# Patient Record
Sex: Female | Born: 1975 | Hispanic: Yes | Marital: Single | Smoking: Never smoker
Health system: Southern US, Community
[De-identification: ages and names within clinical notes are randomized; demographics above are authoritative.]

## PROBLEM LIST (undated history)

## (undated) DIAGNOSIS — T148XXA Other injury of unspecified body region, initial encounter: Secondary | ICD-10-CM

## (undated) DIAGNOSIS — IMO0002 Reserved for concepts with insufficient information to code with codable children: Secondary | ICD-10-CM

## (undated) DIAGNOSIS — R569 Unspecified convulsions: Secondary | ICD-10-CM

## (undated) DIAGNOSIS — F419 Anxiety disorder, unspecified: Secondary | ICD-10-CM

## (undated) DIAGNOSIS — T4145XA Adverse effect of unspecified anesthetic, initial encounter: Secondary | ICD-10-CM

## (undated) DIAGNOSIS — J45909 Unspecified asthma, uncomplicated: Secondary | ICD-10-CM

## (undated) DIAGNOSIS — F329 Major depressive disorder, single episode, unspecified: Secondary | ICD-10-CM

## (undated) DIAGNOSIS — K219 Gastro-esophageal reflux disease without esophagitis: Secondary | ICD-10-CM

## (undated) DIAGNOSIS — G8929 Other chronic pain: Principal | ICD-10-CM

## (undated) DIAGNOSIS — F32A Depression, unspecified: Secondary | ICD-10-CM

## (undated) DIAGNOSIS — T8859XA Other complications of anesthesia, initial encounter: Secondary | ICD-10-CM

## (undated) DIAGNOSIS — K5900 Constipation, unspecified: Secondary | ICD-10-CM

## (undated) DIAGNOSIS — M21379 Foot drop, unspecified foot: Secondary | ICD-10-CM

## (undated) DIAGNOSIS — M549 Dorsalgia, unspecified: Principal | ICD-10-CM

## (undated) DIAGNOSIS — R87619 Unspecified abnormal cytological findings in specimens from cervix uteri: Secondary | ICD-10-CM

## (undated) HISTORY — DX: Other injury of unspecified body region, initial encounter: T14.8XXA

## (undated) HISTORY — DX: Dorsalgia, unspecified: M54.9

## (undated) HISTORY — DX: Reserved for concepts with insufficient information to code with codable children: IMO0002

## (undated) HISTORY — DX: Other chronic pain: G89.29

## (undated) HISTORY — DX: Unspecified abnormal cytological findings in specimens from cervix uteri: R87.619

## (undated) HISTORY — DX: Foot drop, unspecified foot: M21.379

---

## 1998-10-23 DIAGNOSIS — G8929 Other chronic pain: Secondary | ICD-10-CM

## 1998-10-23 HISTORY — DX: Other chronic pain: G89.29

## 1998-10-23 HISTORY — PX: HEMILAMINOTOMY LUMBAR SPINE: SUR654

## 2000-05-29 ENCOUNTER — Inpatient Hospital Stay (HOSPITAL_COMMUNITY): Admission: EM | Admit: 2000-05-29 | Discharge: 2000-06-01 | Payer: Self-pay | Admitting: Emergency Medicine

## 2000-05-29 ENCOUNTER — Encounter: Payer: Self-pay | Admitting: Emergency Medicine

## 2000-05-31 ENCOUNTER — Encounter: Payer: Self-pay | Admitting: Orthopedic Surgery

## 2000-06-13 ENCOUNTER — Encounter: Admission: RE | Admit: 2000-06-13 | Discharge: 2000-07-17 | Payer: Self-pay | Admitting: Orthopedic Surgery

## 2000-06-19 ENCOUNTER — Ambulatory Visit (HOSPITAL_COMMUNITY): Admission: RE | Admit: 2000-06-19 | Discharge: 2000-06-19 | Payer: Self-pay | Admitting: Orthopedic Surgery

## 2000-06-19 ENCOUNTER — Encounter: Payer: Self-pay | Admitting: Orthopedic Surgery

## 2000-07-23 ENCOUNTER — Encounter (INDEPENDENT_AMBULATORY_CARE_PROVIDER_SITE_OTHER): Payer: Self-pay | Admitting: *Deleted

## 2000-07-23 LAB — CONVERTED CEMR LAB

## 2000-08-02 ENCOUNTER — Encounter: Admission: RE | Admit: 2000-08-02 | Discharge: 2000-08-02 | Payer: Self-pay | Admitting: Family Medicine

## 2000-08-02 ENCOUNTER — Other Ambulatory Visit: Admission: RE | Admit: 2000-08-02 | Discharge: 2000-08-02 | Payer: Self-pay | Admitting: Family Medicine

## 2000-11-01 ENCOUNTER — Emergency Department (HOSPITAL_COMMUNITY): Admission: EM | Admit: 2000-11-01 | Discharge: 2000-11-01 | Payer: Self-pay | Admitting: Emergency Medicine

## 2000-11-16 ENCOUNTER — Emergency Department (HOSPITAL_COMMUNITY): Admission: EM | Admit: 2000-11-16 | Discharge: 2000-11-16 | Payer: Self-pay | Admitting: Emergency Medicine

## 2000-11-19 ENCOUNTER — Emergency Department (HOSPITAL_COMMUNITY): Admission: EM | Admit: 2000-11-19 | Discharge: 2000-11-19 | Payer: Self-pay | Admitting: Emergency Medicine

## 2000-12-04 ENCOUNTER — Encounter: Admission: RE | Admit: 2000-12-04 | Discharge: 2000-12-04 | Payer: Self-pay | Admitting: Family Medicine

## 2000-12-05 ENCOUNTER — Ambulatory Visit (HOSPITAL_COMMUNITY): Admission: RE | Admit: 2000-12-05 | Discharge: 2000-12-05 | Payer: Self-pay | Admitting: Orthopedic Surgery

## 2000-12-05 ENCOUNTER — Encounter: Payer: Self-pay | Admitting: Orthopedic Surgery

## 2000-12-11 ENCOUNTER — Encounter: Admission: RE | Admit: 2000-12-11 | Discharge: 2000-12-11 | Payer: Self-pay | Admitting: Sports Medicine

## 2000-12-11 ENCOUNTER — Other Ambulatory Visit: Admission: RE | Admit: 2000-12-11 | Discharge: 2000-12-11 | Payer: Self-pay | Admitting: Family Medicine

## 2000-12-19 ENCOUNTER — Ambulatory Visit (HOSPITAL_COMMUNITY): Admission: RE | Admit: 2000-12-19 | Discharge: 2000-12-19 | Payer: Self-pay | Admitting: Orthopedic Surgery

## 2000-12-19 ENCOUNTER — Encounter: Payer: Self-pay | Admitting: Orthopedic Surgery

## 2001-01-28 ENCOUNTER — Inpatient Hospital Stay (HOSPITAL_COMMUNITY): Admission: RE | Admit: 2001-01-28 | Discharge: 2001-01-29 | Payer: Self-pay | Admitting: Orthopaedic Surgery

## 2001-04-19 ENCOUNTER — Encounter: Admission: RE | Admit: 2001-04-19 | Discharge: 2001-04-19 | Payer: Self-pay | Admitting: Obstetrics & Gynecology

## 2001-05-10 ENCOUNTER — Encounter: Payer: Self-pay | Admitting: Emergency Medicine

## 2001-05-10 ENCOUNTER — Emergency Department (HOSPITAL_COMMUNITY): Admission: EM | Admit: 2001-05-10 | Discharge: 2001-05-11 | Payer: Self-pay | Admitting: Emergency Medicine

## 2001-08-16 ENCOUNTER — Encounter: Admission: RE | Admit: 2001-08-16 | Discharge: 2001-08-16 | Payer: Self-pay | Admitting: Family Medicine

## 2001-08-16 ENCOUNTER — Encounter (INDEPENDENT_AMBULATORY_CARE_PROVIDER_SITE_OTHER): Payer: Self-pay | Admitting: *Deleted

## 2001-08-16 ENCOUNTER — Other Ambulatory Visit: Admission: RE | Admit: 2001-08-16 | Discharge: 2001-08-16 | Payer: Self-pay | Admitting: Family Medicine

## 2001-09-18 ENCOUNTER — Encounter: Admission: RE | Admit: 2001-09-18 | Discharge: 2001-09-18 | Payer: Self-pay | Admitting: Family Medicine

## 2001-11-06 ENCOUNTER — Inpatient Hospital Stay (HOSPITAL_COMMUNITY): Admission: AD | Admit: 2001-11-06 | Discharge: 2001-11-06 | Payer: Self-pay | Admitting: Obstetrics

## 2001-11-19 ENCOUNTER — Encounter: Admission: RE | Admit: 2001-11-19 | Discharge: 2001-11-19 | Payer: Self-pay | Admitting: Family Medicine

## 2001-11-27 ENCOUNTER — Ambulatory Visit (HOSPITAL_COMMUNITY): Admission: RE | Admit: 2001-11-27 | Discharge: 2001-11-27 | Payer: Self-pay | Admitting: Family Medicine

## 2001-12-05 ENCOUNTER — Encounter: Admission: RE | Admit: 2001-12-05 | Discharge: 2001-12-05 | Payer: Self-pay | Admitting: *Deleted

## 2001-12-06 ENCOUNTER — Encounter: Admission: RE | Admit: 2001-12-06 | Discharge: 2001-12-06 | Payer: Self-pay | Admitting: Family Medicine

## 2001-12-18 ENCOUNTER — Inpatient Hospital Stay (HOSPITAL_COMMUNITY): Admission: AD | Admit: 2001-12-18 | Discharge: 2001-12-18 | Payer: Self-pay | Admitting: *Deleted

## 2001-12-23 ENCOUNTER — Encounter (INDEPENDENT_AMBULATORY_CARE_PROVIDER_SITE_OTHER): Payer: Self-pay | Admitting: Specialist

## 2001-12-23 ENCOUNTER — Inpatient Hospital Stay (HOSPITAL_COMMUNITY): Admission: AD | Admit: 2001-12-23 | Discharge: 2001-12-25 | Payer: Self-pay | Admitting: *Deleted

## 2004-07-17 ENCOUNTER — Emergency Department (HOSPITAL_COMMUNITY): Admission: EM | Admit: 2004-07-17 | Discharge: 2004-07-18 | Payer: Self-pay | Admitting: Emergency Medicine

## 2004-08-01 ENCOUNTER — Emergency Department (HOSPITAL_COMMUNITY): Admission: EM | Admit: 2004-08-01 | Discharge: 2004-08-01 | Payer: Self-pay | Admitting: Family Medicine

## 2004-11-18 ENCOUNTER — Emergency Department (HOSPITAL_COMMUNITY): Admission: EM | Admit: 2004-11-18 | Discharge: 2004-11-18 | Payer: Self-pay | Admitting: Emergency Medicine

## 2005-01-30 ENCOUNTER — Emergency Department (HOSPITAL_COMMUNITY): Admission: EM | Admit: 2005-01-30 | Discharge: 2005-01-31 | Payer: Self-pay | Admitting: Emergency Medicine

## 2005-03-30 ENCOUNTER — Emergency Department (HOSPITAL_COMMUNITY): Admission: EM | Admit: 2005-03-30 | Discharge: 2005-03-30 | Payer: Self-pay | Admitting: Emergency Medicine

## 2005-04-06 ENCOUNTER — Emergency Department (HOSPITAL_COMMUNITY): Admission: EM | Admit: 2005-04-06 | Discharge: 2005-04-06 | Payer: Self-pay | Admitting: Family Medicine

## 2005-08-22 ENCOUNTER — Emergency Department (HOSPITAL_COMMUNITY): Admission: EM | Admit: 2005-08-22 | Discharge: 2005-08-22 | Payer: Self-pay | Admitting: Emergency Medicine

## 2005-09-12 ENCOUNTER — Ambulatory Visit: Payer: Self-pay | Admitting: Internal Medicine

## 2005-09-15 ENCOUNTER — Ambulatory Visit (HOSPITAL_COMMUNITY): Admission: RE | Admit: 2005-09-15 | Discharge: 2005-09-15 | Payer: Self-pay | Admitting: Internal Medicine

## 2005-09-20 ENCOUNTER — Ambulatory Visit: Payer: Self-pay | Admitting: Internal Medicine

## 2005-10-09 ENCOUNTER — Emergency Department (HOSPITAL_COMMUNITY): Admission: EM | Admit: 2005-10-09 | Discharge: 2005-10-09 | Payer: Self-pay | Admitting: Emergency Medicine

## 2005-10-10 ENCOUNTER — Emergency Department (HOSPITAL_COMMUNITY): Admission: EM | Admit: 2005-10-10 | Discharge: 2005-10-10 | Payer: Self-pay | Admitting: *Deleted

## 2005-11-01 ENCOUNTER — Ambulatory Visit: Payer: Self-pay | Admitting: Internal Medicine

## 2005-12-11 ENCOUNTER — Emergency Department (HOSPITAL_COMMUNITY): Admission: EM | Admit: 2005-12-11 | Discharge: 2005-12-11 | Payer: Self-pay | Admitting: Emergency Medicine

## 2005-12-20 ENCOUNTER — Ambulatory Visit: Payer: Self-pay | Admitting: Internal Medicine

## 2006-01-15 ENCOUNTER — Ambulatory Visit: Payer: Self-pay | Admitting: Internal Medicine

## 2006-01-30 ENCOUNTER — Emergency Department (HOSPITAL_COMMUNITY): Admission: EM | Admit: 2006-01-30 | Discharge: 2006-01-30 | Payer: Self-pay | Admitting: Family Medicine

## 2006-05-15 ENCOUNTER — Ambulatory Visit: Payer: Self-pay | Admitting: Internal Medicine

## 2006-05-16 ENCOUNTER — Ambulatory Visit: Payer: Self-pay | Admitting: Hospitalist

## 2006-06-29 ENCOUNTER — Emergency Department: Payer: Self-pay | Admitting: Emergency Medicine

## 2006-07-30 ENCOUNTER — Ambulatory Visit: Payer: Self-pay | Admitting: Internal Medicine

## 2006-11-01 ENCOUNTER — Emergency Department (HOSPITAL_COMMUNITY): Admission: EM | Admit: 2006-11-01 | Discharge: 2006-11-01 | Payer: Self-pay | Admitting: Emergency Medicine

## 2006-11-05 DIAGNOSIS — M5410 Radiculopathy, site unspecified: Secondary | ICD-10-CM

## 2006-11-13 ENCOUNTER — Emergency Department (HOSPITAL_COMMUNITY): Admission: EM | Admit: 2006-11-13 | Discharge: 2006-11-13 | Payer: Self-pay | Admitting: Emergency Medicine

## 2006-11-26 ENCOUNTER — Telehealth (INDEPENDENT_AMBULATORY_CARE_PROVIDER_SITE_OTHER): Payer: Self-pay | Admitting: *Deleted

## 2006-12-21 ENCOUNTER — Encounter (INDEPENDENT_AMBULATORY_CARE_PROVIDER_SITE_OTHER): Payer: Self-pay | Admitting: *Deleted

## 2006-12-26 ENCOUNTER — Telehealth: Payer: Self-pay | Admitting: *Deleted

## 2007-01-21 ENCOUNTER — Telehealth: Payer: Self-pay | Admitting: *Deleted

## 2007-01-23 ENCOUNTER — Encounter (INDEPENDENT_AMBULATORY_CARE_PROVIDER_SITE_OTHER): Payer: Self-pay | Admitting: Internal Medicine

## 2007-02-04 ENCOUNTER — Telehealth (INDEPENDENT_AMBULATORY_CARE_PROVIDER_SITE_OTHER): Payer: Self-pay | Admitting: Internal Medicine

## 2007-03-06 ENCOUNTER — Ambulatory Visit: Payer: Self-pay | Admitting: Physical Medicine & Rehabilitation

## 2007-03-06 ENCOUNTER — Encounter
Admission: RE | Admit: 2007-03-06 | Discharge: 2007-06-04 | Payer: Self-pay | Admitting: Physical Medicine & Rehabilitation

## 2007-03-19 ENCOUNTER — Emergency Department (HOSPITAL_COMMUNITY): Admission: EM | Admit: 2007-03-19 | Discharge: 2007-03-19 | Payer: Self-pay | Admitting: Emergency Medicine

## 2007-03-31 ENCOUNTER — Emergency Department (HOSPITAL_COMMUNITY): Admission: EM | Admit: 2007-03-31 | Discharge: 2007-03-31 | Payer: Self-pay | Admitting: Emergency Medicine

## 2007-04-09 ENCOUNTER — Ambulatory Visit: Payer: Self-pay | Admitting: Physical Medicine & Rehabilitation

## 2007-04-18 ENCOUNTER — Encounter
Admission: RE | Admit: 2007-04-18 | Discharge: 2007-07-17 | Payer: Self-pay | Admitting: Physical Medicine & Rehabilitation

## 2007-04-23 ENCOUNTER — Ambulatory Visit: Payer: Self-pay | Admitting: Physical Medicine & Rehabilitation

## 2007-05-21 ENCOUNTER — Ambulatory Visit: Payer: Self-pay | Admitting: Physical Medicine & Rehabilitation

## 2007-06-20 ENCOUNTER — Encounter
Admission: RE | Admit: 2007-06-20 | Discharge: 2007-09-18 | Payer: Self-pay | Admitting: Physical Medicine & Rehabilitation

## 2007-06-22 ENCOUNTER — Ambulatory Visit (HOSPITAL_COMMUNITY)
Admission: RE | Admit: 2007-06-22 | Discharge: 2007-06-22 | Payer: Self-pay | Admitting: Physical Medicine & Rehabilitation

## 2007-07-12 ENCOUNTER — Encounter
Admission: RE | Admit: 2007-07-12 | Discharge: 2007-07-15 | Payer: Self-pay | Admitting: Physical Medicine & Rehabilitation

## 2007-07-15 ENCOUNTER — Ambulatory Visit: Payer: Self-pay | Admitting: Physical Medicine & Rehabilitation

## 2007-07-23 ENCOUNTER — Emergency Department (HOSPITAL_COMMUNITY): Admission: EM | Admit: 2007-07-23 | Discharge: 2007-07-23 | Payer: Self-pay | Admitting: Emergency Medicine

## 2007-08-19 ENCOUNTER — Ambulatory Visit: Payer: Self-pay | Admitting: Physical Medicine & Rehabilitation

## 2007-09-27 ENCOUNTER — Encounter
Admission: RE | Admit: 2007-09-27 | Discharge: 2007-10-01 | Payer: Self-pay | Admitting: Physical Medicine & Rehabilitation

## 2007-10-01 ENCOUNTER — Ambulatory Visit: Payer: Self-pay | Admitting: Physical Medicine & Rehabilitation

## 2007-10-20 ENCOUNTER — Emergency Department (HOSPITAL_COMMUNITY): Admission: EM | Admit: 2007-10-20 | Discharge: 2007-10-20 | Payer: Self-pay | Admitting: Emergency Medicine

## 2007-11-06 ENCOUNTER — Ambulatory Visit: Payer: Self-pay | Admitting: Internal Medicine

## 2007-11-07 ENCOUNTER — Telehealth: Payer: Self-pay | Admitting: Infectious Disease

## 2007-11-07 ENCOUNTER — Encounter: Payer: Self-pay | Admitting: Infectious Disease

## 2007-11-13 ENCOUNTER — Emergency Department (HOSPITAL_COMMUNITY): Admission: EM | Admit: 2007-11-13 | Discharge: 2007-11-14 | Payer: Self-pay | Admitting: Emergency Medicine

## 2007-11-14 ENCOUNTER — Telehealth (INDEPENDENT_AMBULATORY_CARE_PROVIDER_SITE_OTHER): Payer: Self-pay | Admitting: Internal Medicine

## 2007-11-14 ENCOUNTER — Encounter (INDEPENDENT_AMBULATORY_CARE_PROVIDER_SITE_OTHER): Payer: Self-pay | Admitting: Internal Medicine

## 2007-12-03 ENCOUNTER — Telehealth: Payer: Self-pay | Admitting: Licensed Clinical Social Worker

## 2007-12-16 ENCOUNTER — Encounter (INDEPENDENT_AMBULATORY_CARE_PROVIDER_SITE_OTHER): Payer: Self-pay | Admitting: Internal Medicine

## 2007-12-16 ENCOUNTER — Ambulatory Visit: Payer: Self-pay | Admitting: Internal Medicine

## 2007-12-16 LAB — CONVERTED CEMR LAB
Barbiturate Quant, Ur: NEGATIVE
HCV Ab: NEGATIVE
Hep B Core Total Ab: NEGATIVE
Hepatitis B Surface Ag: NEGATIVE
Phencyclidine (PCP): NEGATIVE
Propoxyphene: NEGATIVE

## 2007-12-17 ENCOUNTER — Encounter (INDEPENDENT_AMBULATORY_CARE_PROVIDER_SITE_OTHER): Payer: Self-pay | Admitting: Internal Medicine

## 2007-12-17 ENCOUNTER — Telehealth: Payer: Self-pay | Admitting: *Deleted

## 2007-12-17 LAB — CONVERTED CEMR LAB
Candida species: NEGATIVE
Gardnerella vaginalis: POSITIVE — AB
Trichomonal Vaginitis: NEGATIVE

## 2007-12-25 ENCOUNTER — Encounter (INDEPENDENT_AMBULATORY_CARE_PROVIDER_SITE_OTHER): Payer: Self-pay | Admitting: Internal Medicine

## 2008-01-03 ENCOUNTER — Telehealth (INDEPENDENT_AMBULATORY_CARE_PROVIDER_SITE_OTHER): Payer: Self-pay | Admitting: Internal Medicine

## 2008-01-06 ENCOUNTER — Encounter (INDEPENDENT_AMBULATORY_CARE_PROVIDER_SITE_OTHER): Payer: Self-pay | Admitting: Internal Medicine

## 2008-01-29 ENCOUNTER — Encounter (INDEPENDENT_AMBULATORY_CARE_PROVIDER_SITE_OTHER): Payer: Self-pay | Admitting: Internal Medicine

## 2008-02-03 ENCOUNTER — Telehealth: Payer: Self-pay | Admitting: *Deleted

## 2008-03-03 ENCOUNTER — Ambulatory Visit: Payer: Self-pay | Admitting: Infectious Disease

## 2008-05-05 ENCOUNTER — Telehealth: Payer: Self-pay | Admitting: *Deleted

## 2008-05-16 ENCOUNTER — Emergency Department (HOSPITAL_COMMUNITY): Admission: EM | Admit: 2008-05-16 | Discharge: 2008-05-16 | Payer: Self-pay | Admitting: Emergency Medicine

## 2008-06-11 ENCOUNTER — Telehealth (INDEPENDENT_AMBULATORY_CARE_PROVIDER_SITE_OTHER): Payer: Self-pay | Admitting: *Deleted

## 2008-07-07 ENCOUNTER — Telehealth (INDEPENDENT_AMBULATORY_CARE_PROVIDER_SITE_OTHER): Payer: Self-pay | Admitting: *Deleted

## 2008-07-09 ENCOUNTER — Encounter: Payer: Self-pay | Admitting: Internal Medicine

## 2008-08-10 ENCOUNTER — Telehealth: Payer: Self-pay | Admitting: *Deleted

## 2008-08-10 ENCOUNTER — Encounter (INDEPENDENT_AMBULATORY_CARE_PROVIDER_SITE_OTHER): Payer: Self-pay | Admitting: Internal Medicine

## 2008-10-02 ENCOUNTER — Ambulatory Visit: Payer: Self-pay | Admitting: Internal Medicine

## 2008-11-04 ENCOUNTER — Ambulatory Visit: Payer: Self-pay | Admitting: Internal Medicine

## 2008-11-04 ENCOUNTER — Encounter: Payer: Self-pay | Admitting: Internal Medicine

## 2008-12-03 ENCOUNTER — Telehealth: Payer: Self-pay | Admitting: *Deleted

## 2008-12-04 ENCOUNTER — Encounter (INDEPENDENT_AMBULATORY_CARE_PROVIDER_SITE_OTHER): Payer: Self-pay | Admitting: Internal Medicine

## 2009-01-06 ENCOUNTER — Encounter: Payer: Self-pay | Admitting: Internal Medicine

## 2009-01-14 ENCOUNTER — Ambulatory Visit: Payer: Self-pay | Admitting: Internal Medicine

## 2009-02-16 ENCOUNTER — Ambulatory Visit: Payer: Self-pay | Admitting: Internal Medicine

## 2009-03-16 ENCOUNTER — Telehealth: Payer: Self-pay | Admitting: Internal Medicine

## 2009-03-18 ENCOUNTER — Encounter: Payer: Self-pay | Admitting: Internal Medicine

## 2009-04-22 ENCOUNTER — Ambulatory Visit: Payer: Self-pay | Admitting: Internal Medicine

## 2009-04-22 DIAGNOSIS — O99810 Abnormal glucose complicating pregnancy: Secondary | ICD-10-CM | POA: Insufficient documentation

## 2009-05-19 ENCOUNTER — Telehealth: Payer: Self-pay | Admitting: Internal Medicine

## 2009-05-19 ENCOUNTER — Encounter: Payer: Self-pay | Admitting: Internal Medicine

## 2009-05-28 ENCOUNTER — Ambulatory Visit: Payer: Self-pay | Admitting: Internal Medicine

## 2009-06-16 ENCOUNTER — Telehealth: Payer: Self-pay | Admitting: Internal Medicine

## 2009-06-17 ENCOUNTER — Encounter: Payer: Self-pay | Admitting: Internal Medicine

## 2009-07-14 ENCOUNTER — Telehealth: Payer: Self-pay | Admitting: Internal Medicine

## 2009-07-16 ENCOUNTER — Encounter: Payer: Self-pay | Admitting: Infectious Diseases

## 2009-08-16 ENCOUNTER — Ambulatory Visit: Payer: Self-pay | Admitting: Internal Medicine

## 2009-09-13 ENCOUNTER — Telehealth: Payer: Self-pay | Admitting: *Deleted

## 2009-09-13 ENCOUNTER — Encounter: Payer: Self-pay | Admitting: Internal Medicine

## 2009-09-28 ENCOUNTER — Ambulatory Visit: Payer: Self-pay | Admitting: Internal Medicine

## 2009-10-12 ENCOUNTER — Telehealth (INDEPENDENT_AMBULATORY_CARE_PROVIDER_SITE_OTHER): Payer: Self-pay | Admitting: *Deleted

## 2009-10-12 ENCOUNTER — Encounter: Payer: Self-pay | Admitting: Internal Medicine

## 2009-10-19 ENCOUNTER — Ambulatory Visit: Payer: Self-pay | Admitting: Internal Medicine

## 2009-10-19 ENCOUNTER — Telehealth (INDEPENDENT_AMBULATORY_CARE_PROVIDER_SITE_OTHER): Payer: Self-pay | Admitting: Internal Medicine

## 2009-10-19 LAB — CONVERTED CEMR LAB
Amphetamine Screen, Ur: NEGATIVE
Barbiturate Quant, Ur: NEGATIVE
Creatinine,U: 395.5 mg/dL
Marijuana Metabolite: NEGATIVE
Methadone: NEGATIVE
Opiates: POSITIVE — AB
Phencyclidine (PCP): NEGATIVE
Propoxyphene: NEGATIVE

## 2009-10-23 HISTORY — PX: CHOLECYSTECTOMY: SHX55

## 2009-10-27 ENCOUNTER — Telehealth: Payer: Self-pay | Admitting: Internal Medicine

## 2009-11-01 ENCOUNTER — Ambulatory Visit: Payer: Self-pay | Admitting: Internal Medicine

## 2009-11-02 ENCOUNTER — Encounter (INDEPENDENT_AMBULATORY_CARE_PROVIDER_SITE_OTHER): Payer: Self-pay | Admitting: Internal Medicine

## 2009-11-02 LAB — CONVERTED CEMR LAB
Methadone: NEGATIVE
Phencyclidine (PCP): NEGATIVE

## 2009-11-22 ENCOUNTER — Ambulatory Visit: Payer: Self-pay | Admitting: Internal Medicine

## 2009-11-22 ENCOUNTER — Encounter (INDEPENDENT_AMBULATORY_CARE_PROVIDER_SITE_OTHER): Payer: Self-pay | Admitting: Internal Medicine

## 2009-11-29 ENCOUNTER — Encounter: Payer: Self-pay | Admitting: Internal Medicine

## 2009-11-30 ENCOUNTER — Telehealth: Payer: Self-pay | Admitting: Internal Medicine

## 2009-12-29 ENCOUNTER — Telehealth: Payer: Self-pay | Admitting: Internal Medicine

## 2009-12-30 ENCOUNTER — Encounter: Payer: Self-pay | Admitting: Internal Medicine

## 2010-01-27 ENCOUNTER — Ambulatory Visit: Payer: Self-pay | Admitting: Internal Medicine

## 2010-02-22 ENCOUNTER — Telehealth: Payer: Self-pay | Admitting: Internal Medicine

## 2010-02-23 ENCOUNTER — Encounter: Payer: Self-pay | Admitting: Internal Medicine

## 2010-03-22 ENCOUNTER — Encounter: Payer: Self-pay | Admitting: Internal Medicine

## 2010-03-22 ENCOUNTER — Telehealth: Payer: Self-pay | Admitting: Internal Medicine

## 2010-04-15 ENCOUNTER — Emergency Department (HOSPITAL_COMMUNITY): Admission: EM | Admit: 2010-04-15 | Discharge: 2010-04-15 | Payer: Self-pay | Admitting: Family Medicine

## 2010-04-19 ENCOUNTER — Telehealth: Payer: Self-pay | Admitting: Internal Medicine

## 2010-04-19 ENCOUNTER — Encounter: Payer: Self-pay | Admitting: Internal Medicine

## 2010-05-16 ENCOUNTER — Telehealth: Payer: Self-pay | Admitting: Internal Medicine

## 2010-05-19 ENCOUNTER — Encounter: Payer: Self-pay | Admitting: Internal Medicine

## 2010-06-20 ENCOUNTER — Telehealth: Payer: Self-pay | Admitting: Internal Medicine

## 2010-06-21 ENCOUNTER — Encounter: Payer: Self-pay | Admitting: Internal Medicine

## 2010-07-12 ENCOUNTER — Telehealth: Payer: Self-pay | Admitting: Internal Medicine

## 2010-07-25 ENCOUNTER — Encounter: Payer: Self-pay | Admitting: Internal Medicine

## 2010-07-25 ENCOUNTER — Telehealth: Payer: Self-pay | Admitting: Internal Medicine

## 2010-07-26 ENCOUNTER — Encounter: Payer: Self-pay | Admitting: Internal Medicine

## 2010-08-25 ENCOUNTER — Telehealth: Payer: Self-pay | Admitting: Internal Medicine

## 2010-08-30 ENCOUNTER — Encounter: Payer: Self-pay | Admitting: Internal Medicine

## 2010-10-04 ENCOUNTER — Ambulatory Visit: Payer: Self-pay | Admitting: Internal Medicine

## 2010-10-06 ENCOUNTER — Ambulatory Visit: Payer: Self-pay

## 2010-10-06 ENCOUNTER — Ambulatory Visit: Payer: Self-pay | Admitting: Internal Medicine

## 2010-10-06 LAB — CONVERTED CEMR LAB
Amphetamine Screen, Ur: NEGATIVE
Barbiturate Quant, Ur: NEGATIVE
Cocaine Metabolites: POSITIVE — AB
Creatinine,U: 264.5 mg/dL

## 2010-11-02 ENCOUNTER — Telehealth: Payer: Self-pay | Admitting: Internal Medicine

## 2010-11-03 ENCOUNTER — Telehealth: Payer: Self-pay | Admitting: Internal Medicine

## 2010-11-22 NOTE — Medication Information (Signed)
Summary: RX HISTORY REPORT  RX HISTORY REPORT   Imported By: Margie Billet 07/27/2010 14:12:35  _____________________________________________________________________  External Attachment:    Type:   Image     Comment:   External Document

## 2010-11-22 NOTE — Assessment & Plan Note (Signed)
Summary: to adjust medication/cfb   Vital Signs:  Patient profile:   35 year old female Height:      59 inches (149.86 cm) Weight:      124.7 pounds (56.68 kg) BMI:     25.28 O2 Sat:      100 % on Room air Temp:     98.2 degrees F (36.78 degrees C) oral Pulse rate:   64 / minute BP sitting:   105 / 50  (right arm) Cuff size:   regular  Vitals Entered By: Krystal Eaton Duncan Dull) (November 22, 2009 10:14 AM)  O2 Flow:  Room air Is Patient Diabetic? No Pain Assessment Patient in pain? yes     Location: back Intensity: 9 Onset of pain  Constant since last Friday  Does patient need assistance? Functional Status Self care Ambulation Normal   Primary Care Provider:  Mliss Sax MD   History of Present Illness: This is a  year old woman with past medical history of   She is here today to follow up on her pain managment.  There was some confusion previously with abnormal UDS's (negative) but I have discussed this with the patient and have determined that she did not have access to her regular narcotics before those UDS's... so was negative.  She was started on long acting oxycontin with oxycodon for breakthrough at last appt and is here to discuss.  She says that it took a few days, but eventually the new regimen is working well for her.  She only takes the short acting once a day in the afternoon to get her to the pm dose of long acting.  She did hurt her back in a new way this weekend.  Pain in hips.  Preventive Screening-Counseling & Management  Alcohol-Tobacco     Alcohol drinks/day: <1     Smoking Status: never     Year Quit: AT THE AGE  OF 21  Current Medications (verified): 1)  Oxycodone Hcl 5 Mg  Caps (Oxycodone Hcl) .... Take 1 Tablet Every 6-8 Hours As Needed For Pain. 2)  Ibuprofen 600 Mg Tabs (Ibuprofen) .... Take 1 Tablet By Mouth Every 6-8 Hours As Needed For Pain. 3)  Oxycontin 15 Mg Xr12h-Tab (Oxycodone Hcl) .... Take One Tablet Two Times A Day For  Pain.  Allergies (verified): No Known Drug Allergies  Review of Systems       per hpi  Physical Exam  General:  alert and well-developed.   Head:  normocephalic and atraumatic.   Lungs:  normal respiratory effort and normal breath sounds.   Heart:  normal rate, regular rhythm, and no murmur.   Msk:  pain with movement of the right hip.  pain with palpation behind the right trochanter.  no pain with rotation of the hip.  some Neurologic:  alert & oriented X3, cranial nerves II-XII intact, strength normal in all extremities, and gait normal.   Skin:  no suspicious lesions.   Psych:  Oriented X3, memory intact for recent and remote, normally interactive, and good eye contact.     Impression & Recommendations:  Problem # 1:  CHRONIC PAIN SYNDROME (ICD-338.4) Is happier on the new regimen of long acting opiate for control and short acting for break through.  Feels fairly well controled and likes taking fewer pills.  Will continue.  Last UDS was appropriate and these should be continued every 6 months or so.  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  Has not been taking ibuprofen, will try this and heading pad as well as movement/stretching.  If symptoms persist greater than 2 weeks will call for another appointment.   The following medications were removed from the medication list:    Tramadol Hcl 50 Mg Tabs (Tramadol hcl) .Marland Kitchen... Take 1 tablet twice a day for pain as needed. Her updated medication list for this problem includes:    Oxycodone Hcl 5 Mg Caps (Oxycodone hcl) .Marland Kitchen... Take 1 tablet every 6-8 hours as needed for pain.    Ibuprofen 600 Mg Tabs (Ibuprofen) .Marland Kitchen... Take 1 tablet by mouth every 6-8 hours as needed for pain.    Oxycontin 15 Mg Xr12h-tab (Oxycodone hcl) .Marland Kitchen... Take one tablet two times a day for pain.  Complete Medication List: 1)  Oxycodone Hcl 5 Mg Caps  (Oxycodone hcl) .... Take 1 tablet every 6-8 hours as needed for pain. 2)  Ibuprofen 600 Mg Tabs (Ibuprofen) .... Take 1 tablet by mouth every 6-8 hours as needed for pain. 3)  Oxycontin 15 Mg Xr12h-tab (Oxycodone hcl) .... Take one tablet two times a day for pain.  Patient Instructions: 1)  You should try ibuprofen, heat and movement for your new right hip pain.  If symptoms are not very much improved by the end of next week than call for another clinic appointment.   Prevention & Chronic Care Immunizations   Influenza vaccine: Fluvax 3+  (08/16/2009)    Tetanus booster: Not documented   Td booster deferral: Not indicated  (05/28/2009)    Pneumococcal vaccine: Not documented  Other Screening   Pap smear:  Specimen Adequacy: Satisfactory for evaluation.   Interpretation/Result:Negative for intraepithelial Lesion or Malignancy.     (12/17/2007)   Pap smear action/deferral: Deferred-3 yr interval  (05/28/2009)   Pap smear due: 12/2008   Smoking status: never  (11/22/2009)

## 2010-11-22 NOTE — Medication Information (Signed)
Summary: [facility]   Imported By: Margie Billet 04/27/2010 11:05:39  _____________________________________________________________________  External Attachment:    Type:   Image     Comment:   External Document

## 2010-11-22 NOTE — Progress Notes (Signed)
Summary: refill/ hla  Phone Note Refill Request Message from:  Patient on April 19, 2010 2:37 PM  Refills Requested: Medication #1:  OXYCODONE HCL 5 MG  CAPS Take 1 tablet every 6 hours as needed for pain.   Dosage confirmed as above?Dosage Confirmed   Last Refilled: 6/2  Medication #2:  OXYCONTIN 15 MG XR12H-TAB Take one tablet two times a day for pain..   Dosage confirmed as above?Dosage Confirmed   Last Refilled: 6/2 Initial call taken by: Marin Roberts RN,  April 19, 2010 2:38 PM  Follow-up for Phone Call        completed refill, thank you Danielle Lento  Follow-up by: Mliss Sax MD,  April 20, 2010 7:55 AM    Prescriptions: OXYCONTIN 15 MG XR12H-TAB (OXYCODONE HCL) Take one tablet two times a day for pain.  #64 x 0   Entered and Authorized by:   Mliss Sax MD   Signed by:   Mliss Sax MD on 04/20/2010   Method used:   Handwritten   RxID:   [id number] OXYCODONE HCL 5 MG  CAPS (OXYCODONE HCL) Take 1 tablet every 6 hours as needed for pain.  #90 x 0   Entered and Authorized by:   Mliss Sax MD   Signed by:   Mliss Sax MD on 04/20/2010   Method used:   Handwritten   RxID:   [id number]

## 2010-11-22 NOTE — Progress Notes (Signed)
Summary: refill/gg  Phone Note Refill Request  on Mar 22, 2010 4:38 PM  Refills Requested: Medication #1:  OXYCODONE HCL 5 MG  CAPS Take 1 tablet every 6 hours as needed for pain.   Last Refilled: 02/23/2010  Medication #2:  OXYCONTIN 15 MG XR12H-TAB Take one tablet two times a day for pain..   Last Refilled: 02/23/2010 call when ready @ [phone]   Method Requested: Pick up at Office Initial call taken by: Merrie Roof RN,  Mar 22, 2010 4:38 PM  Follow-up for Phone Call        I will come by in 30 mins and leave it at the front for pick up  thanks Follow-up by: Mliss Sax MD,  March 24, 2010 1:19 PM    Prescriptions: OXYCONTIN 15 MG XR12H-TAB (OXYCODONE HCL) Take one tablet two times a day for pain.  #64 x 0   Entered and Authorized by:   Mliss Sax MD   Signed by:   Mliss Sax MD on 03/24/2010   Method used:   Handwritten   RxID:   [id number] OXYCODONE HCL 5 MG  CAPS (OXYCODONE HCL) Take 1 tablet every 6 hours as needed for pain.  #90 x 0   Entered and Authorized by:   Mliss Sax MD   Signed by:   Mliss Sax MD on 03/24/2010   Method used:   Handwritten   RxID:   [id number]

## 2010-11-22 NOTE — Assessment & Plan Note (Signed)
Summary: f/u UDS, wants pain med/pcp-magick/hla   Vital Signs:  Patient profile:   35 year old female Height:      59 inches (149.86 cm) Weight:      124.5 pounds (56.59 kg) BMI:     25.24 Temp:     97.0 degrees F (36.11 degrees C) oral Pulse rate:   78 / minute BP sitting:   131 / 78  (right arm)  Vitals Entered By: Stanton Kidney Ditzler RN (November 01, 2009 2:04 PM) Is Patient Diabetic? No Pain Assessment Patient in pain? yes     Location: left leg Intensity: 5 Onset of pain  past 9 years Nutritional Status BMI of 25 - 29 = overweight Nutritional Status Detail appetite good  Have you ever been in a relationship where you felt threatened, hurt or afraid?denies   Does patient need assistance? Functional Status Self care Ambulation Normal Comments Refills on pain med.   Primary Care Provider:  Mliss Sax MD   History of Present Illness: This is a 35 year old woman with past medical history of   [id number] ; hx of pROM, hx of gest. 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  She is here for refill on her pain medication.  There has been some concern over her adherence/use of oxycodone because her recent UDS was negative for oxycodone and positive for opiates.  See a+p for further discussion.  She has no complaints, is just here to discuss medications.    Depression History:      The patient denies a depressed mood most of the day and a diminished interest in her usual daily activities.         Preventive Screening-Counseling & Management  Alcohol-Tobacco     Alcohol drinks/day: <1     Smoking Status: never     Year Quit: AT THE AGE  OF 21  Caffeine-Diet-Exercise     Does Patient Exercise: no     Type of exercise: WALKING     Times/week: 1  Current Medications (verified): 1)  Oxycodone Hcl 5 Mg  Caps (Oxycodone Hcl) .... Take 1  Tablet Every 4 - 6 Hours As Needed For Pain. 2)  Ibuprofen 600 Mg Tabs (Ibuprofen) .... Take 1 Tablet By Mouth Every 6-8 Hours As Needed For Pain. 3)  Tramadol Hcl 50 Mg Tabs (Tramadol Hcl) .... Take 1 Tablet Twice A Day For Pain As Needed.  Allergies (verified): No Known Drug Allergies  Review of Systems       per hpi  Physical Exam  General:  alert.     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 Has had nerve conduction study showing injury to S1 nerve on left. Has tried pain clinic be can not afford payments. Has worked with [facility] docs in the past on her pain regimen and was last on 150 oxycodone tablets a month. Now has had confusing UDS.  See chronic pain syndrome problem.    Her updated medication list for this problem includes:    Oxycodone Hcl 5 Mg Caps (Oxycodone hcl) .Marland Kitchen... Take 1 tablet every 6-8 hours as needed for pain.    Ibuprofen  600 Mg Tabs (Ibuprofen) .Marland Kitchen... Take 1 tablet by mouth every 6-8 hours as needed for pain.    Tramadol Hcl 50 Mg Tabs (Tramadol hcl) .Marland Kitchen... Take 1 tablet twice a day for pain as needed.    Oxycontin 15 Mg Xr12h-tab (Oxycodone hcl) .Marland Kitchen... Take one tablet two times a day for pain.  Problem # 2:  CHRONIC PAIN SYNDROME (ICD-338.4)  Some confusion today because of UDS showing negative for oxycodone (prescribed) and positive for opiates.  She says that she did not have access to her prescription for several days because the clinic was closed so had not been able to take oxycodone before the UDS.  She was taking tramadol which COULD show up as an opiate on a UDS (I called the lab to confirm this)  She has been on oxycodone 5mg  q4hrs, and she takes it that regularly.  I will start her on oxycontin 15mg  two times a day with oxycodone  5mg  q 8 hrs as needed for break through. Will get UDS today. Will sign new pain contract today. She is to call within the next week if she has trouble with the new regimen.  Orders: T-Drug Screen-Urine, (single) [phone])  Complete Medication List: 1)  Oxycodone Hcl 5 Mg Caps (Oxycodone hcl) .... Take 1 tablet every 6-8 hours as needed for pain. 2)  Ibuprofen 600 Mg Tabs (Ibuprofen) .... Take 1 tablet by mouth every 6-8 hours as needed for pain. 3)  Tramadol Hcl 50 Mg Tabs (Tramadol hcl) .... Take 1 tablet twice a day for pain as needed. 4)  Oxycontin 15 Mg Xr12h-tab (Oxycodone hcl) .... Take one tablet two times a day for pain.  Patient Instructions: 1)  Please schedule a follow-up appointment in 1 month. Prescriptions: OXYCODONE HCL 5 MG  CAPS (OXYCODONE HCL) Take 1 tablet every 6-8 hours as needed for pain.  #64 x 0   Entered and Authorized by:   Elby Showers MD   Signed by:   Elby Showers MD on 11/01/2009   Method used:   Print then Give to Patient   RxID:   [id number] OXYCONTIN 15 MG XR12H-TAB (OXYCODONE HCL) Take one tablet two times a day for pain.  #64 x 0   Entered and Authorized by:   Elby Showers MD   Signed by:   Elby Showers MD on 11/01/2009   Method used:   Print then Give to Patient   RxID:   [id number]  Process Orders Check Orders Results:     [facility]: ABN not required for this insurance Tests Sent for requisitioning (November 02, 2009 10:10 AM):     11/01/2009: [facility] -- T-Drug Screen-Urine, (single) [[id number]] (signed)    Prevention & Chronic Care Immunizations   Influenza vaccine: Fluvax 3+  (08/16/2009)    Tetanus booster: Not documented   Td booster deferral: Not indicated  (05/28/2009)    Pneumococcal vaccine: Not documented  Other Screening   Pap smear:  Specimen Adequacy: Satisfactory for evaluation.   Interpretation/Result:Negative for intraepithelial Lesion or Malignancy.      (12/17/2007)   Pap smear action/deferral: Deferred-3 yr interval  (05/28/2009)   Pap smear due: 12/2008   Smoking status: never  (11/01/2009)
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
## 2010-11-22 NOTE — Medication Information (Signed)
Summary: RX Folder  RX Folder   Imported By: Shon Hough 05/24/2010 15:21:20  _____________________________________________________________________  External Attachment:    Type:   Image     Comment:   External Document

## 2010-11-22 NOTE — Progress Notes (Signed)
Summary: phone note/gp  Phone Note Outgoing Call   Summary of Call: Pt. was called about RXs. for Oxycodone and Oxycontin ready for pick up here at the clinic.  Pt. stated she had already picked up the Rxs requested Aug. 29;  signed for on Sept. 9.   RXs were destroyed and witnessed by Marin Roberts, RN. Initial call taken by: Chinita Pester RN,  July 12, 2010 9:50 AM

## 2010-11-22 NOTE — Medication Information (Signed)
Summary: Tax adviser   Imported By: Florinda Marker 01/04/2010 09:50:42  _____________________________________________________________________  External Attachment:    Type:   Image     Comment:   External Document

## 2010-11-22 NOTE — Progress Notes (Signed)
Summary: refill/ hla  Phone Note Call from Patient Message from:  Patient on August 25, 2010 5:17 PM  Refills Requested: Medication #1:  OXYCODONE HCL 5 MG  CAPS Take 1 tablet every 6 hours as needed for pain.   Dosage confirmed as above?Dosage Confirmed   Last Refilled: 10/4  Medication #2:  OXYCONTIN 15 MG XR12H-TAB Take one tablet two times a day for pain..   Dosage confirmed as above?Dosage Confirmed   Last Refilled: 10/4 Initial call taken by: Marin Roberts RN,  August 25, 2010 5:18 PM  Follow-up for Phone Call        This is approved for teh patient, however I am not in the hospital, can I please have [facility] attending sign these scripts for the patinet. Thank you Iskra Follow-up by: Mliss Sax MD,  August 26, 2010 8:36 AM    Prescriptions: OXYCONTIN 15 MG XR12H-TAB (OXYCODONE HCL) Take one tablet two times a day for pain.  #64 x 0   Entered and Authorized by:   Mliss Sax MD   Signed by:   Mliss Sax MD on 08/26/2010   Method used:   Historical   RxID:   [id number] OXYCODONE HCL 5 MG  CAPS (OXYCODONE HCL) Take 1 tablet every 6 hours as needed for pain.  #90 x 0   Entered and Authorized by:   Mliss Sax MD   Signed by:   Mliss Sax MD on 08/26/2010   Method used:   Historical   RxID:   [id number]   Appended Document: refill/ hla scripts are not in clinic, have paged dr Aldine Contes  Appended Document: refill/ hla hatfi,wilm [facility] cannot be located. Will re-print and give to patient.   Clinical Lists Changes  Medications: Rx of OXYCONTIN 15 MG XR12H-TAB (OXYCODONE HCL) Take one tablet two times a day for pain.;  #64 x 0;  Signed;  Entered by: Julaine Fusi  DO;  Authorized by: Julaine Fusi  DO;  Method used: Reprint Rx of OXYCODONE HCL 5 MG  CAPS (OXYCODONE HCL) Take 1 tablet every 6 hours as needed for pain.;  #90 x 0;  Signed;  Entered by: Julaine Fusi  DO;  Authorized by: Julaine Fusi  DO;  Method used:  Reprint    Prescriptions: OXYCODONE HCL 5 MG  CAPS (OXYCODONE HCL) Take 1 tablet every 6 hours as needed for pain.  #90 x 0   Entered and Authorized by:   Julaine Fusi  DO   Signed by:   Julaine Fusi  DO on 08/30/2010   Method used:   Reprint   RxID:   [id number] OXYCONTIN 15 MG XR12H-TAB (OXYCODONE HCL) Take one tablet two times a day for pain.  #64 x 0   Entered and Authorized by:   Julaine Fusi  DO   Signed by:   Julaine Fusi  DO on 08/30/2010   Method used:   Reprint   RxID:   [id number]

## 2010-11-22 NOTE — Miscellaneous (Signed)
Summary: Medication Contract  Medication Contract   Imported By: Florinda Marker 11/02/2009 14:15:07  _____________________________________________________________________  External Attachment:    Type:   Image     Comment:   External Document

## 2010-11-22 NOTE — Medication Information (Signed)
Summary: OXYCONTIN/OXYCODONE  OXYCONTIN/OXYCODONE   Imported By: Margie Billet 07/05/2010 12:07:27  _____________________________________________________________________  External Attachment:    Type:   Image     Comment:   External Document  Appended Document: OXYCONTIN/OXYCODONE    Prescriptions: OXYCONTIN 15 MG XR12H-TAB (OXYCODONE HCL) Take one tablet two times a day for pain.  #64 x 0   Entered and Authorized by:   Mliss Sax MD   Signed by:   Mliss Sax MD on 07/10/2010   Method used:   Print then Give to Patient   RxID:   [id number] OXYCODONE HCL 5 MG  CAPS (OXYCODONE HCL) Take 1 tablet every 6 hours as needed for pain.  #90 x 0   Entered and Authorized by:   Mliss Sax MD   Signed by:   Mliss Sax MD on 07/10/2010   Method used:   Print then Give to Patient   RxID:   [id number]

## 2010-11-22 NOTE — Progress Notes (Signed)
Summary: refill/ hla  Phone Note Refill Request Message from:  Patient on Feb 22, 2010 9:18 AM  Refills Requested: Medication #1:  OXYCODONE HCL 5 MG  CAPS Take 1 tablet every 6 hours as needed for pain.   Last Refilled: 4/7  Medication #2:  OXYCONTIN 15 MG XR12H-TAB Take one tablet two times a day for pain..   Last Refilled: 4/7 Initial call taken by: Marin Roberts RN,  Feb 22, 2010 9:18 AM  Follow-up for Phone Call        completed refill, thank you Victoria Ellis  Follow-up by: Mliss Sax MD,  Feb 23, 2010 2:11 PM    Prescriptions: OXYCONTIN 15 MG XR12H-TAB (OXYCODONE HCL) Take one tablet two times a day for pain.  #64 x 0   Entered and Authorized by:   Mliss Sax MD   Signed by:   Mliss Sax MD on 02/23/2010   Method used:   Print then Give to Patient   RxID:   [id number] OXYCODONE HCL 5 MG  CAPS (OXYCODONE HCL) Take 1 tablet every 6 hours as needed for pain.  #90 x 0   Entered and Authorized by:   Mliss Sax MD   Signed by:   Mliss Sax MD on 02/23/2010   Method used:   Print then Give to Patient   RxID:   [id number]   Appended Document: refill/ hla Above Rxs. ready for pick up;  pt. called.

## 2010-11-22 NOTE — Medication Information (Signed)
Summary: Tax adviser   Imported By: Florinda Marker 12/03/2009 16:14:09  _____________________________________________________________________  External Attachment:    Type:   Image     Comment:   External Document

## 2010-11-22 NOTE — Progress Notes (Signed)
Summary: Refill/gh  Phone Note Refill Request Message from:  Patient on November 30, 2009 9:57 AM  Refills Requested: Medication #1:  OXYCONTIN 15 MG XR12H-TAB Take one tablet two times a day for pain..   Last Refilled: 11/01/2009  Medication #2:  OXYCODONE HCL 5 MG  CAPS Take 1 tablet every 6-8 hours as needed for pain.   Last Refilled: 11/01/2009  Method Requested: Pick up at Office Initial call taken by: Angelina Ok RN,  November 30, 2009 9:58 AM  Follow-up for Phone Call        completed, will bring the script down to clinic today Follow-up by: Mliss Sax MD,  December 01, 2009 11:31 AM    Prescriptions: OXYCONTIN 15 MG XR12H-TAB (OXYCODONE HCL) Take one tablet two times a day for pain.  #64 x 0   Entered and Authorized by:   Mliss Sax MD   Signed by:   Mliss Sax MD on 12/01/2009   Method used:   Print then Give to Patient   RxID:   [id number] OXYCODONE HCL 5 MG  CAPS (OXYCODONE HCL) Take 1 tablet every 6-8 hours as needed for pain.  #64 x 0   Entered and Authorized by:   Mliss Sax MD   Signed by:   Mliss Sax MD on 12/01/2009   Method used:   Print then Give to Patient   RxID:   [id number]

## 2010-11-22 NOTE — Progress Notes (Signed)
Summary: refill/gg  Phone Note Refill Request  on December 29, 2009 3:11 PM  Refills Requested: Medication #1:  OXYCODONE HCL 5 MG  CAPS Take 1 tablet every 6-8 hours as needed for pain.   Last Refilled: 12/01/2009  Medication #2:  OXYCONTIN 15 MG XR12H-TAB Take one tablet two times a day for pain..   Last Refilled: 12/01/2009 Pt # [phone]   Method Requested: Pick up at Office Initial call taken by: Merrie Roof RN,  December 29, 2009 3:11 PM  Follow-up for Phone Call        completed Follow-up by: Mliss Sax MD,  December 30, 2009 1:50 PM    Prescriptions: OXYCONTIN 15 MG XR12H-TAB (OXYCODONE HCL) Take one tablet two times a day for pain.  #64 x 0   Entered and Authorized by:   Mliss Sax MD   Signed by:   Mliss Sax MD on 12/30/2009   Method used:   Print then Give to Patient   RxID:   [id number] OXYCODONE HCL 5 MG  CAPS (OXYCODONE HCL) Take 1 tablet every 6-8 hours as needed for pain.  #64 x 0   Entered and Authorized by:   Mliss Sax MD   Signed by:   Mliss Sax MD on 12/30/2009   Method used:   Print then Give to Patient   RxID:   [id number]

## 2010-11-22 NOTE — Progress Notes (Signed)
Summary: refill/ hla  Phone Note Refill Request Message from:  Patient on June 20, 2010 10:30 AM  Refills Requested: Medication #1:  OXYCODONE HCL 5 MG  CAPS Take 1 tablet every 6 hours as needed for pain.   Dosage confirmed as above?Dosage Confirmed   Last Refilled: 7/28  Medication #2:  OXYCONTIN 15 MG XR12H-TAB Take one tablet two times a day for pain..   Dosage confirmed as above?Dosage Confirmed   Last Refilled: 7/28 Initial call taken by: Marin Roberts RN,  June 20, 2010 10:30 AM  Follow-up for Phone Call        completed refill, thank you Iskra  Follow-up by: Mliss Sax MD,  June 21, 2010 3:14 PM    Prescriptions: OXYCONTIN 15 MG XR12H-TAB (OXYCODONE HCL) Take one tablet two times a day for pain.  #64 x 0   Entered and Authorized by:   Mliss Sax MD   Signed by:   Mliss Sax MD on 06/21/2010   Method used:   Print then Give to Patient   RxID:   [id number] OXYCODONE HCL 5 MG  CAPS (OXYCODONE HCL) Take 1 tablet every 6 hours as needed for pain.  #90 x 0   Entered and Authorized by:   Mliss Sax MD   Signed by:   Mliss Sax MD on 06/21/2010   Method used:   Print then Give to Patient   RxID:   [id number]

## 2010-11-22 NOTE — Progress Notes (Signed)
Summary: refill/ hla  Phone Note Refill Request Message from:  Patient on October 27, 2009 11:40 AM  Refills Requested: Medication #1:  OXYCODONE HCL 5 MG  CAPS Take 1 tablet every 4 - 6 hours as needed for pain.   Last Refilled: 12/21 this is quite confusing, please review drug screen, notes since nov  Initial call taken by: Marin Roberts RN,  October 27, 2009 11:44 AM  Follow-up for Phone Call        Review of a recent urine drug screen for oxycodone was negative even though she had been given a 2 week supply the week before.  This needs to be explained by Victoria Ellis before she gets another refill of a narcotic from this clinic.  If it can not be explained it is unlikely the prescription will be refilled at that appointment.  She is scheduled for the next available non-overbook appointment in our clinic which is 1/10 @ 1:50 PM. Follow-up by: Doneen Poisson MD,  October 27, 2009 5:04 PM  Additional Follow-up for Phone Call Additional follow up Details #1::        i spoke w/ pt via ph, she was quite angry but said she would come for appt.Marland KitchenMarland Kitchen1/10 at 1350 Additional Follow-up by: Marin Roberts RN,  October 28, 2009 5:40 PM

## 2010-11-22 NOTE — Medication Information (Signed)
Summary: OXYCONTIN 15MG /OXYCODONE 5MG   OXYCONTIN 15MG /OXYCODONE 5MG    Imported By: Margie Billet 02/24/2010 15:24:16  _____________________________________________________________________  External Attachment:    Type:   Image     Comment:   External Document

## 2010-11-22 NOTE — Medication Information (Signed)
Summary: OXYCODONE/OXYCONTIN  OXYCODONE/OXYCONTIN   Imported By: Margie Billet 09/05/2010 14:49:15  _____________________________________________________________________  External Attachment:    Type:   Image     Comment:   External Document

## 2010-11-22 NOTE — Letter (Signed)
Summary: Out of Work  [facility]  [address]   [place], [place] [address]   Phone: [phone]  Fax: [phone]    November 22, 2009   Employee:  KEMONIE CUTILLO Molitor    To Whom It May Concern:   For Medical reasons, please excuse the above named employee from work for the following dates:  Start:   11/22/09  End:   11/22/09  If you need additional information, please feel free to contact our office.         Sincerely,    Elby Showers MD

## 2010-11-22 NOTE — Medication Information (Signed)
Summary: OXYCONTIN/OXYCODONE  OXYCONTIN/OXYCODONE   Imported By: Margie Billet 03/28/2010 11:10:06  _____________________________________________________________________  External Attachment:    Type:   Image     Comment:   External Document

## 2010-11-22 NOTE — Medication Information (Signed)
Summary: OXYCODONE/OXYCONTIN  OXYCODONE/OXYCONTIN   Imported By: Margie Billet 07/28/2010 11:24:44  _____________________________________________________________________  External Attachment:    Type:   Image     Comment:   External Document

## 2010-11-22 NOTE — Progress Notes (Signed)
Summary: Refill/gh  Phone Note Refill Request Message from:  Patient on July 25, 2010 10:42 AM  Refills Requested: Medication #1:  OXYCODONE HCL 5 MG  CAPS Take 1 tablet every 6 hours as needed for pain.  Medication #2:  OXYCONTIN 15 MG XR12H-TAB Take one tablet two times a day for pain.. Last visit in April 2011.  Appointment with Dr. Aldine Contes was cancelled.   Method Requested: Pick up at Office Initial call taken by: Angelina Ok RN,  July 25, 2010 10:42 AM  Follow-up for Phone Call        These were last filled 07/10/10 and can not be filled until 08/09/10. Follow-up by: Zoila Shutter MD,  July 25, 2010 11:01 AM  Additional Follow-up for Phone Call Additional follow up Details #1::        I think the Rx written on 9/18 were destroyed. Narcotics Filled by pharmacy on : 9/10 oxycontin # 64   9/2 oxycodone  5 mg # 90     Additional Follow-up by: Merrie Roof RN,  July 25, 2010 4:20 PM    Additional Follow-up for Phone Call Additional follow up Details #2::    Called pharmacy to cofirm.  Previous information by [facility] correct.  Pt is [facility] different [facility] it seems (although pharmacy has access to this info).  Will run opiate database, if no red flags will fill the oxycodone as it is prescribed q6 but supply for q8 assuming she takes it round the clock.  Should consider getting both oxycodone and oxycontin on same refill schdule. Follow-up by: Mariea Stable MD,  July 26, 2010 8:26 AM  Additional Follow-up for Phone Call Additional follow up Details #3:: Details for Additional Follow-up Action Taken: No red flags on report.....Marland Kitchensee scanned document. Will provide refills on both but for oxycodone, however, will write for oxycontin as well but cannot be filled until 10/10. Additional Follow-up by: Mariea Stable MD,  July 26, 2010 8:44 AM  Prescriptions: OXYCONTIN 15 MG XR12H-TAB (OXYCODONE HCL) Take one tablet two times a day for  pain.  #64 x 0   Entered by:   Mariea Stable MD   Authorized by:   Marland Kitchen [facility] ATTENDING DESKTOP   Signed by:   Mariea Stable MD on 07/26/2010   Method used:   Print then Give to Patient   RxID:   [id number] OXYCODONE HCL 5 MG  CAPS (OXYCODONE HCL) Take 1 tablet every 6 hours as needed for pain.  #90 x 0   Entered by:   Mariea Stable MD   Authorized by:   Marland Kitchen [facility] ATTENDING DESKTOP   Signed by:   Mariea Stable MD on 07/26/2010   Method used:   Print then Give to Patient   RxID:   [id number]   Appended Document: Refill/gh [facility] called to clarify who refilled the above Rxs.; there was not a printed MD name  on the Rxs. Signed by Dr. Onalee Hua (see scanned in copy).

## 2010-11-22 NOTE — Progress Notes (Signed)
Summary: refill/ hla  Phone Note Refill Request Message from:  Patient on May 16, 2010 12:46 PM  Refills Requested: Medication #1:  OXYCODONE HCL 5 MG  CAPS Take 1 tablet every 6 hours as needed for pain.   Last Refilled: 6/28  Medication #2:  OXYCONTIN 15 MG XR12H-TAB Take one tablet two times a day for pain..   Last Refilled: 6/28 Initial call taken by: Marin Roberts RN,  May 16, 2010 12:46 PM  Follow-up for Phone Call        completed refill, thank you Simrit Gohlke  Follow-up by: Mliss Sax MD,  May 17, 2010 12:31 PM    Prescriptions: OXYCONTIN 15 MG XR12H-TAB (OXYCODONE HCL) Take one tablet two times a day for pain.  #64 x 0   Entered and Authorized by:   Mliss Sax MD   Signed by:   Mliss Sax MD on 05/17/2010   Method used:   Handwritten   RxID:   [id number] OXYCODONE HCL 5 MG  CAPS (OXYCODONE HCL) Take 1 tablet every 6 hours as needed for pain.  #90 x 0   Entered and Authorized by:   Mliss Sax MD   Signed by:   Mliss Sax MD on 05/17/2010   Method used:   Handwritten   RxID:   [id number]

## 2010-11-24 ENCOUNTER — Encounter: Payer: Self-pay | Admitting: Internal Medicine

## 2010-11-24 ENCOUNTER — Ambulatory Visit (INDEPENDENT_AMBULATORY_CARE_PROVIDER_SITE_OTHER): Payer: BC Managed Care – PPO | Admitting: Internal Medicine

## 2010-11-24 ENCOUNTER — Ambulatory Visit: Admit: 2010-11-24 | Payer: Self-pay

## 2010-11-24 DIAGNOSIS — M549 Dorsalgia, unspecified: Secondary | ICD-10-CM

## 2010-11-24 DIAGNOSIS — G8929 Other chronic pain: Secondary | ICD-10-CM

## 2010-11-24 DIAGNOSIS — IMO0002 Reserved for concepts with insufficient information to code with codable children: Secondary | ICD-10-CM

## 2010-11-24 MED ORDER — OXYCODONE HCL 5 MG PO CAPS: 5.0000 mg | ORAL_CAPSULE | Freq: Four times a day (QID) | ORAL | Status: DC | PRN
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
## 2010-11-24 NOTE — Progress Notes (Signed)
Summary: phone/gg  Phone Note Call from Patient   Caller: Patient Summary of Call: Pt called and wanted to get Rx for pain meds.  I informed her that she had violated her pain contract and Dr Aldine Contes was not going to refill. She needs to make appointment and talk with the doctor about this.  I did not give her any other information about what the UDS showed. Pt hung up on me. Initial call taken by: Merrie Roof RN,  November 03, 2010 5:22 PM  Follow-up for Phone Call        thank you  Antwonette Feliz Follow-up by: Mliss Sax MD,  November 04, 2010 10:16 AM

## 2010-11-24 NOTE — Patient Instructions (Signed)
Please schedule follow appoointment in 1 month.

## 2010-11-24 NOTE — Assessment & Plan Note (Signed)
Pt has violated the contract and is aware of the clinic policies. However, I have discussed this case with Dr. Aundria Rud and Dr. Rogelia Boga, I do think that pt has objective evidence of source of her pain based on her MRI findings and physical exam findings and she is working full time. 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 We have discussed in detail (>1hr discussion) about cocaine use and substance abuse in general and since she has not violated the contract in the past we have agreed to refill her short acting medication and will continue to monitor her status.

## 2010-11-24 NOTE — Progress Notes (Signed)
  Subjective:    Patient ID: Victoria Ellis, female    DOB: 10-29-1975, 35 y.o.   MRN: [id number]  HPI  35 yo female with chronic back pain presents today to [facility] to discuss the pain medication regimen. She was not given a refill on her medications last month because she was told her UDS was positive for cocaine and she has violated the contract. She explains that she does not use cocaine and this was one incidence for her. She has 4 children at home and works full time, does not drink or smokes. She had to work less hours per week due to her pain and explains that her pain regimen was adequate in pain control and she feels like she should get something for her pain. She is aware she has violated the contract but also mentions that she has never done it in the past and would like to see what her options are since she still continues to have radiating, sharp lower back pain which is controlled on pain medications. She denies any systemic symptoms of fever, chills, weight loss/gain, or sweats, no recent sicknesses or hospitalizations.   Review of Systems  Constitutional: Negative.   Eyes: Negative.   Respiratory: Negative.   Cardiovascular: Negative.   Genitourinary: Negative.        Objective:   Physical Exam  Constitutional: She appears well-developed and well-nourished. No distress.  Cardiovascular: Normal rate, regular rhythm and intact distal pulses.  Exam reveals no gallop and no friction rub.   No murmur heard. Pulmonary/Chest: Effort normal and breath sounds normal. No respiratory distress. She has no wheezes. She has no rales. She exhibits no tenderness.  Musculoskeletal:       Lumbar back: She exhibits decreased range of motion, tenderness, bony tenderness, pain and spasm. She exhibits no swelling, no edema, no deformity, no laceration and normal pulse.       Right upper leg: She exhibits bony tenderness.       Left upper leg: She exhibits bony tenderness.       Right lower leg: She  exhibits bony tenderness.       Left lower leg: She exhibits bony tenderness.  Skin: She is not diaphoretic.          Assessment & Plan:

## 2010-11-24 NOTE — Progress Notes (Signed)
Summary: refill/ hla  Phone Note Refill Request Message from:  Patient on November 02, 2010 3:19 PM  Refills Requested: Medication #1:  OXYCODONE HCL 5 MG  CAPS Take 1 tablet every 6 hours as needed for pain.   Dosage confirmed as above?Dosage Confirmed   Last Refilled: 12/13  Medication #2:  OXYCONTIN 15 MG XR12H-TAB Take one tablet two times a day for pain..   Dosage confirmed as above?Dosage Confirmed   Last Refilled: 12/13 please refer to 12/15 drug screen  Initial call taken by: Marin Roberts RN,  November 02, 2010 3:19 PM  Follow-up for Phone Call        I think this pt has violated the contract by having UDS positive for cocaine so I can not do this refill for her thank you Shirell Struthers Follow-up by: Mliss Sax MD,  November 03, 2010 2:33 PM

## 2010-11-24 NOTE — Progress Notes (Signed)
Dr Aldine Contes only needed to talk with pt about pain med - no VS needed.

## 2010-12-21 ENCOUNTER — Encounter: Payer: Self-pay | Admitting: Internal Medicine

## 2010-12-21 ENCOUNTER — Ambulatory Visit (INDEPENDENT_AMBULATORY_CARE_PROVIDER_SITE_OTHER): Payer: BC Managed Care – PPO | Admitting: Internal Medicine

## 2010-12-21 DIAGNOSIS — IMO0002 Reserved for concepts with insufficient information to code with codable children: Secondary | ICD-10-CM

## 2010-12-21 MED ORDER — TRAMADOL HCL 50 MG PO TABS: 50.0000 mg | ORAL_TABLET | Freq: Two times a day (BID) | ORAL | Status: DC | PRN

## 2010-12-21 MED ORDER — OXYCODONE HCL 5 MG PO CAPS: 5.0000 mg | ORAL_CAPSULE | Freq: Four times a day (QID) | ORAL | Status: DC | PRN

## 2010-12-21 MED ORDER — ZOLPIDEM TARTRATE 10 MG PO TABS: 10.0000 mg | ORAL_TABLET | Freq: Every evening | ORAL | Status: DC | PRN

## 2010-12-21 NOTE — Assessment & Plan Note (Signed)
Pain is well controlled on current medication regimen. Will provide additional refill. Patient is okay to come back for followup in 6 months.Marland Kitchen

## 2010-12-21 NOTE — Patient Instructions (Signed)
Please schedule follow up appointment in 6 months.  

## 2010-12-21 NOTE — Progress Notes (Signed)
  Subjective:    Patient ID: Victoria Ellis, female    DOB: June 18, 1976, 35 y.o.   MRN: [id number]  HPI  patient is 35 year old female with past medical history outlined below who presents to clinic for regular followup on chronic pain management. She denies any new concerns at this time. No recent hospitalizations or sicknesses. Her current pain medication regimen is working well for her. Pain is well controlled. She needs refills on her medication   Review of Systems  Constitutional: Negative.   HENT: Negative.   Respiratory: Negative.   Cardiovascular: Negative.        Objective:   Physical Exam  Constitutional: She is oriented to person, place, and time. She appears well-developed and well-nourished. No distress.  Cardiovascular: Normal rate, regular rhythm, normal heart sounds and intact distal pulses.  Exam reveals no gallop and no friction rub.   No murmur heard. Pulmonary/Chest: Effort normal and breath sounds normal. No respiratory distress. She has no rales. She exhibits no tenderness.  Neurological: She is alert and oriented to person, place, and time. She has normal reflexes. She displays normal reflexes. No cranial nerve deficit. She exhibits normal muscle tone. Coordination normal.  Skin: She is not diaphoretic.  Psychiatric: She has a normal mood and affect. Her behavior is normal. Judgment and thought content normal.          Assessment & Plan:

## 2011-01-03 ENCOUNTER — Encounter: Payer: BC Managed Care – PPO | Admitting: Internal Medicine

## 2011-01-26 ENCOUNTER — Other Ambulatory Visit: Payer: Self-pay | Admitting: *Deleted

## 2011-01-26 DIAGNOSIS — IMO0002 Reserved for concepts with insufficient information to code with codable children: Secondary | ICD-10-CM

## 2011-01-26 NOTE — Telephone Encounter (Signed)
Pt request refill on oxycodone 5 mg.  Last filled 2/29 It looks like it was deleted from med list??   Will you refill? Pt # [phone]

## 2011-02-02 NOTE — Telephone Encounter (Signed)
Unable to reach Dr Aldine Contes, can you help? Pt calling again

## 2011-02-03 NOTE — Telephone Encounter (Signed)
Rx given to pt. 

## 2011-02-04 MED ORDER — OXYCODONE HCL 5 MG PO CAPS: 5.0000 mg | ORAL_CAPSULE | Freq: Four times a day (QID) | ORAL | Status: DC | PRN

## 2011-02-04 NOTE — Telephone Encounter (Signed)
Addended byMliss Sax on: 02/04/2011 11:48 AM   Modules accepted: Orders

## 2011-02-07 MED ORDER — TRAMADOL HCL 50 MG PO TABS: 50.0000 mg | ORAL_TABLET | Freq: Two times a day (BID) | ORAL | Status: DC | PRN

## 2011-02-07 MED ORDER — ZOLPIDEM TARTRATE 10 MG PO TABS: 10.0000 mg | ORAL_TABLET | Freq: Every evening | ORAL | Status: DC | PRN

## 2011-02-07 MED ORDER — OXYCODONE HCL 5 MG PO CAPS: 5.0000 mg | ORAL_CAPSULE | Freq: Four times a day (QID) | ORAL | Status: DC | PRN

## 2011-02-07 NOTE — Telephone Encounter (Signed)
Addended byMliss Sax on: 02/07/2011 01:34 PM   Modules accepted: Orders

## 2011-03-01 ENCOUNTER — Other Ambulatory Visit: Payer: Self-pay | Admitting: *Deleted

## 2011-03-01 DIAGNOSIS — IMO0002 Reserved for concepts with insufficient information to code with codable children: Secondary | ICD-10-CM

## 2011-03-01 NOTE — Telephone Encounter (Signed)
Last refill 4/17  Will hold until 5/15 Pt # [phone]

## 2011-03-07 MED ORDER — OXYCODONE HCL 5 MG PO CAPS: 5.0000 mg | ORAL_CAPSULE | Freq: Four times a day (QID) | ORAL | Status: DC | PRN

## 2011-03-07 NOTE — Assessment & Plan Note (Signed)
Victoria Ellis is back regarding her left leg and low back pain. Dr. Wynn Banker  has tried two left S1 transforaminal injections and the patient states  she has not had relief with either. Although she has been out of her  narcotic medications for the last few weeks, so she states it is a bit  difficult to judge if it is more the medication being gone than the  injection not working. She continues to work full time. She struggles  with left leg pain particularly in the evening and night which effects  her sleep. She rates her pain at 9 out of 10. Pain is sharp, burning,  dull, stabbing, constant, and aching. She stopped the Keppra due to lack  of money. She did not get the Imipramine filled as apparently that was  missed at the pharmacy.   REVIEW OF SYSTEMS:  Notable for the above. Patient still reports  depression, poor appetite. Full review is in the written health and  history section in the chart.   SOCIAL HISTORY:  Patient is married but lives separately. She is working  as much as she can as her back and leg tolerate.   PHYSICAL EXAMINATION:  Blood pressure is 142/83, pulse is 79,  respiratory rate 18. She is sating 100% in room air. Patient is  generally pleasant. Her mood is much brighter today than it was at last  visit. She is alert and oriented x3. Reflexes in the legs are 2+ at the  right and left knee, and right ankle. She is trace absent in left ankle.  She has weakness with plantar flexion on the left but generally weakness  throughout due to pain inhibition to a great extent. She has dysesthesia  and subjective sensory loss in the S1 distribution primarily. Back is  tender. She has a positive straight leg and seated slump test today.  HEART: Regular.  CHEST:  ABDOMEN: Soft, nontender.   ASSESSMENT:  1. 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  2. Reactive depression.  3. Insomnia.   PLAN:  1. Refilled Percocet #120 today 1 q. 6 hours p.r.n.  2.  Begin trial of Elavil 25 mg 1 to 2 at bedtime.  3. We will speak with Dr. Wynn Banker regarding further injections. He      had mentioned an L5 transforaminal injection, although I am not      convinced that this would be a great benefit.  I wonder if an L5-S1      translaminar approach might be more useful.  We are bit restricted      with other medication options due to her funds.  4. I will see her back in about 1 months time.      Ranelle Oyster, M.D.  Electronically Signed     ZTS/MedQ  D:  06/21/2007 11:55:12  T:  06/22/2007 10:24:36  Job #:  [id number]   cc:   Peggye Pitt, M.D.  Fax: [phone]

## 2011-03-07 NOTE — Telephone Encounter (Signed)
Rx ready, pt informed 

## 2011-03-07 NOTE — Assessment & Plan Note (Signed)
Victoria Ellis is back regarding her leg pain. She has done a little bit better  with the Keppra though it has made her drowsy, but she is tolerating it  and working still. She states that her work has not been effected. She  did not get the Tofranil filled as it was a brand name and her policy  would not cover. They did not give her an alternative. She is using the  Percocet for breakthrough pain, 1 q.6 hours p.r.n. Sleep remains fair.  Her pain is an 8 out of 10. She describes it as sharp, burning, and  aching, particularly from the left back into the left foot.   REVIEW OF SYSTEMS:  Patient reports numbness, tingling, and spasms, poor  appetite. Full review is in the written health and history section.   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   PHYSICAL EXAMINATION:  Blood pressure is 122/76, pulse is 61,  respiratory rate 16, she is sating 100% in room air. Patient is  pleasant, alert and oriented x3. She appears a bit fatigued but is very  appropriate. Mood in good. She continues to have some weakness still  with ankle plantar flexion and inversion on the left with decreased  Achilles reflexes. Straight leg testing was positive still on the left  side. Low back was tender along the L5, S1 paraspinals on the left.  Seated slump test was positive.  HEART: Regular.  CHEST: Clear.  ABDOMEN: Soft, nontender.   ASSESSMENT:  1. History of L5, S1 herniated disc with S1 nerve root involvement and      persistent S1 radiculopathy.  2. Reactive depression.  3. Insomnia.   PLAN:  1. Patient has improved somewhat with the Keppra. I would like to set      her up for an S1 nerve root block on the left by Dr. Wynn Banker as      soon as an appointment is available.  2. Hold off on tricyclic antidepressant due to fatigue with Keppra for      now. If Keppra related fatigue does not improve over the next few      weeks we will consider  another agent.  3. Consider TENS unit trial.  4. I will see back pending injection course.      Ranelle Oyster, M.D.  Electronically Signed     ZTS/MedQ  D:  04/12/2007 13:07:50  T:  04/12/2007 16:32:17  Job #:  [id number]   cc:   Peggye Pitt, M.D.  Fax: [phone]

## 2011-03-07 NOTE — Assessment & Plan Note (Signed)
Santosha is back regarding her left S1 radiculopathy.  We have tried two  left S1 transforaminal injections as well as a L5-S1 translaminar  paramedian injection without any benefit.  She felt that the last  injection may have irritated her back more than anything.  She finds  that the Elavil is helping her a bit but she is only taking 25 mg at  night.  She did not go up to the 50 as I had recommended that she needed  to.  She uses Percocet for breakthrough pain but does not feel like it  is helping a great deal.  She describes her pain as sharp, burning,  intermittent dull stabbing, constant tingling, aching.  Sleep is poor  though somewhat improved with the Elavil.  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  She is not working  nearly the hours that she had been accustomed to previously.   REVIEW OF SYSTEMS:  Notable for numbness, tingling, trouble walking,  spasms, depression, anxiety, poor appetite.  Other pertinent positives  listed above and full review is in the written health and history  section.   SOCIAL HISTORY:  Patient is married and living with her family  currently.   PHYSICAL EXAMINATION:  Blood pressure is 120/60, pulse of 77,  respiratory rate 18, she is sating 100% on room air.  Patient is  pleasant, alert and oriented x3.  Affect is anxious and depressed in  appearance today.  Gait is antalgic to the left.  She has continued  weakness in the ankle, particularly with plantar flexion, somewhat with  dorsiflexion as well as knee extension and flexion and although some of  that is pain related.  She has a regular heart rate and rhythm.  Chest  is clear.  Abdomen is soft, nontender.   ASSESSMENT:  1. Persistent S1 radiculopathy, failing multiple injections.  2. Reactive anxiety and depression.  3. Insomnia.   PLAN:  1. Increase Elavil 50 to 100 mg nightly.  2. Increase Percocet from 5 to 7.5/325 one q.6 hours p.r.n.  3. Gave patient  information regarding spinal stimulators.  I think we      may have to go this route.  I asked her to call me back with      questions.  Will consider referral to Dr. Sharolyn Douglas for evaluation      there.  4. Otherwise I will see the patient back in 1 months.  Check urine      drug screen at that visit.      Ranelle Oyster, M.D.  Electronically Signed     ZTS/MedQ  D:  08/20/2007 11:29:31  T:  08/20/2007 13:57:51  Job #:  [id number]   cc:   Peggye Pitt, M.D.  Fax: [phone]

## 2011-03-07 NOTE — Procedures (Signed)
NAMELEANDRIA, THIER NO.:  [id number]   MEDICAL RECORD NO.:  [id number]          PATIENT TYPE:  REC   LOCATION:  [facility]                          FACILITY:  [facility]   PHYSICIAN:  Erick Colace, M.D.DATE OF BIRTH:  14-Mar-1976   DATE OF PROCEDURE:  04/23/2007  DATE OF DISCHARGE:                               OPERATIVE REPORT   04/23/07   PROCEDURE:  This is a left S1 transforaminal epidural steroid injection.   INDICATIONS:  Back and left lower extremity pain, has a history of left  S1 radiculitis and radiculopathy, pain level 7/10 only partially  improved with medication management.   Informed consent was obtained after describing risks and benefits of the  procedure to the patient.  These include bleeding, bruising, infection,  loss of bowel or bladder function, temporary or permanent paralysis.  She elected to proceed and has given her consent.   The patient placed prone on the fluoroscopy table.  Betadine prep,  sterile drape, 25 gauge 1.5 inch needle was used to anesthetize the skin  and subcu tissue, 1% lidocaine x2 mL, and a 22 gauge 3.5 inch spinal  needle was inserted under fluoroscopic guidance into the left S1  foramen.   AP lateral images utilized.  Omnipaque 180 x0.5 mL demonstrated no  intravascular uptake.  Then a solution containing 1 mL of 40 mg/mL Depo-  Medrol and 2 mL of 1% lidocaine were injected.  The patient tolerated  the procedure well.  Pre and postinjection vitals stable.  Postinjection  instructions given.  Pre and post injection pain level 7/10.      Erick Colace, M.D.  Electronically Signed     AEK/MEDQ  D:  04/23/2007 08:51:26  T:  04/23/2007 11:43:32  Job:  [id number]   cc:   Ranelle Oyster, M.D.  Fax: [phone]   Peggye Pitt, M.D.  Fax: [phone]

## 2011-03-07 NOTE — Assessment & Plan Note (Signed)
Victoria Ellis is back regarding her left leg pain.  She did not feel that the  last S1 transforaminal injection provided any relief.  She does feel  that the Keppra has helped somewhat.  She was unable to afford Tofranil-  PM.  She is using Percocet for breakthrough pain.  Her urine test is  positive for cocaine.  I addressed this with the patient today and she  states that she has used drugs since then.  She states that she has been  using drugs due to the fact that her pain has increased.  She describes  the pain as sharp, burning, stabbing, intermittent, constant, and  aching.  The pain increases with activity.  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   REVIEW OF SYSTEMS:  Notable for numbness, tingling, trouble walking,  spasms.  She has some insomnia.  Other pertinent positives are listed  above.   SOCIAL HISTORY:  The patient is married and lives alone.   PHYSICAL EXAMINATION:  VITAL SIGNS:  Blood pressure is 128/71, pulse is  101, respiratory rate 18, saturation is 99% on room air.  GENERAL:  The patient is flat.  When we discussed her drug issues, she  became tearful.  PAIN AND REHAB EVALUATION:  The left leg is unchanged with decreased  reflexes at the heel.  She remains antalgic with left-sided  weightbearing.  She has weakness with ankle plantar flexion and  inversion of the foot.  Straight leg testing was positive on the left.  She was tender in the left low back particularly.  HEART:  Tachycardic.  CHEST:  Clear.  ABDOMEN:  Soft and nontender.   ASSESSMENT:  1. History of L5-S1 herniated disk with persistent S1 radiculopathy on      the left.  2. Reactive depression.  3. Insomnia.   PLAN:  1. Will set up for repeat injection of the left S1 nerve root.  2. Increase Keppra to 500 mg b.i.d.  3. I will not write the patient any further narcotics at this point.      I will test her urine again in one month's time.  She has to make      choice over whether she will  stay off the drugs or not.  I stated      that another positive test would mean discharge from the clinic.  4. Try Imipramine low dose 25 to 50 mg q.h.s. for sleep and pain.  5. I will see her back in about one month.      Ranelle Oyster, M.D.  Electronically Signed     ZTS/MedQ  D:  05/22/2007 81:19:14  T:  05/22/2007 22:53:41  Job #:  [id number]   cc:   Peggye Pitt, M.D.  Fax: [phone]

## 2011-03-07 NOTE — Procedures (Signed)
Victoria Ellis, Victoria Ellis NO.:  [id number]   MEDICAL RECORD NO.:  [id number]           PATIENT TYPE:   LOCATION:                                 FACILITY:   PHYSICIAN:  Erick Colace, M.D.DATE OF BIRTH:  Sep 19, 1976   DATE OF PROCEDURE:  DATE OF DISCHARGE:                               OPERATIVE REPORT   This 35 year old female with chronic left lower extremity radicular pain  has had EMG showing S1 radiculopathy, and imaging studies showed some  lateral recess stenosis affecting L5 nerve root.  She really had no  significant improvement with the first left S1 transforaminal injection  dated April 23, 2007.  She is here for repeat.   She is not on any anticoagulants, any antibiotics, or any  antiinflammatories.  Her pain is rated as 8/10.   Informed consent was obtained after describing the risks and benefits of  the procedure to the patient.  These include bleeding, bruising,  infection, loss of bowel or bladder function, temporary or permanent  paralysis.  She elected to proceed.  A spine model utilized to explain.  She has given written consent.   The patient was placed prone on the fluoroscopy table, with Betadine  prep, sterile drape.  A 25 gauge 1-1/2 inch was used to incise skin and  subcutaneous tissue, 1% lidocaine x2 cc.  Then, a 22-gauge 3-1/2 inch  spinal needle was inserted under fluoroscopic guidance to the left S1  foramen.  AP and lateral images utilized.  Omnipaque 180 x0.5 mL  demonstrated good epidural spread, followed by injection of 1 cc of 40  mg/cc Depo-Medrol. 2 cc of 1% lidocaine.  The patient tolerated the  procedure well.  Pre- and postinjection vitals stable.  If she has no  significant relief with this injection, would recommend next injection  via L5 left side.  The patient will follow up Dr. Riley Kill to further  assess.      Erick Colace, M.D.  Electronically Signed     AEK/MEDQ  D:  05/30/2007 17:19:41  T:   05/31/2007 10:04:16  Job:  [id number]

## 2011-03-07 NOTE — Procedures (Signed)
Victoria Ellis, Victoria Ellis               ACCOUNT NO.:  [id number]   MEDICAL RECORD NO.:  [id number]          PATIENT TYPE:  REC   LOCATION:  [facility]                          FACILITY:  [facility]   PHYSICIAN:  Erick Colace, M.D.DATE OF BIRTH:  1976-04-29   DATE OF PROCEDURE:  07/15/2007  DATE OF DISCHARGE:                               OPERATIVE REPORT   PROCEDURE:  L5-S1 translaminar paramedian approach epidural steroid  injection under fluoroscopic guidance.   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 The pain is 8/10 despite medication management.  She did not have any appreciable relief with bilateral S1 transforaminal  epidural steroid injections.   Informed consent was obtained after describing the risks and benefits of  the procedure. The patient placed prone on the fluoroscopy table,  Betadine prep, sterile drape, 25-gauge inch and a half needle was used  to anesthetize the skin and subcu tissue. 1% lidocaine x3 mL and an 18  gauge needle was inserted under fluoroscopic guidance targeting the  inferior lamina of the left L5. Bone contact made, confirmed with  lateral imaging. I redirected the needle inferiorly, loss of resistance  obtained and Omnipaque demonstrated good epidural spread and then a  solution containing 2 mL of 3 mL of 40 mg/cc  per mL Depo-Medrol and 3  mL of 1% MPF lidocaine injected.  She states she had some left lower  extremity pain during the injection and we injected 2 mL total.  The  patient tolerated the remainder of the procedure well. Post injection  instructions given. She was able to ambulate without difficulty. Post  injection vitals stable.  The patient was recovered and post injection  pain level 8/10 followed by  nursing visit.      Erick Colace, M.D.  Electronically Signed     AEK/MEDQ  D:  07/15/2007 11:30:22  T:  07/15/2007 17:15:41  Job:  [id number]
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
## 2011-03-10 NOTE — Op Note (Signed)
[facility]  Patient:    Victoria Ellis, Victoria Ellis                    MRN: [id number] Proc. Date: 01/28/01 Adm. Date:  [id number] Attending:  Jacki Cones                           Operative Report  PREOPERATIVE DIAGNOSIS:  Left L5-S1 herniated nucleus pulposus.  POSTOPERATIVE DIAGNOSIS:  Left L5-S1 herniated nucleus pulposus.  OPERATION PERFORMED:  Left L5-S1 microdiskectomy.  SURGEON:  Mark C. Ophelia Charter, M.D.  ANESTHESIA:  GOT.  ASSISTANT:  Reynolds Bowl, M.D.  ESTIMATED BLOOD LOSS:  Less than 100 ml.  DESCRIPTION OF PROCEDURE:  After induction of general anesthesia, the patient was placed on an Andrews frame in the kneeling position with careful padding and positioning.  Back was prepped with DuraPrep and preoperative Ancef was given prophylactically.  The area was squared away with towels.  Betadine and Vi-drape applied and a needle placed.  18 gauge spinal and a cross-table lateral x-ray confirmed that the needle was just inferior to the 5-1 disk space.  An incision was made just superior to this.  The lamina of L5 and the sacral lamina was identified.  Subperiosteal dissection of the lamina was performed.  Soft tissue was released from the undersurface or inferior edge of the lamina with cup curet.  The ligamentum was incised with a scalpel, patty used to protect the dura and ligamentum was removed with the Kerrison rongeur. There was some fat tissue present dorsally over the nerve root.  The nerve root take off was actually high and disk space was actually the level of the axilla.  Following the lateral gutter down, the nerve root was retracted to the midline and directly underneath this was the bulging disk which was retained by the ligament.  The ligament was incised and chunks of disk material were removed until the disk was decompressed flat.  Foraminotomy was performed.  There was no disk material at the midline.  Passes were made  with the micropituitary up, down, straight and regular pituitaries up and straight. Once this was done, the nerve was able to lie flat with no area compression. Passes were made in the axilla as well as along the shoulder 180 sweep in all areas of compression.  After irrigation with saline solution, once again, Taylor retractor was removed from its lateral position.  The fascia was closed with 0 Vicryl interrupted sutures, 2-0 Vicryl in the subcutaneous tissues, 4-0 Vicryl subcuticular skin closure.  Tincture of benzoin and Steri-Strips, Adaptic, 4 x 4s and tape were applied.  Instrument count and needle count were correct. DD:  01/28/01 TD:  01/28/01 Job: [id number]

## 2011-03-10 NOTE — Op Note (Signed)
[facility]  Patient:    Victoria Ellis, Victoria Ellis Visit Number: [id number] MRN: [id number]          Service Type: OBS Location: 910A 9101 01 Attending Physician:  Enid Cutter Dictated by:   Ed Blalock. Burnadette Peter, M.D. Proc. Date: 12/24/01 Admit Date:  12/23/2001                             Operative Report  DATE OF BIRTH:                11-30-1975  PREOPERATIVE DIAGNOSIS:       Multiparity, desires permanent sterilization.  POSTOPERATIVE DIAGNOSIS:      Multiparity, desires permanent sterilization.  OPERATION:                    Postpartum bilateral tubal ligation.  SURGEON:                      Elisha Ponder, M.D.  ASSISTANT:                    Ed Blalock. Burnadette Peter, M.D. and Alesia Banda, P.A.                               student  ANESTHESIA:                   General endotracheal.  COMPLICATIONS:                None.  ESTIMATED BLOOD LOSS:         Less than 20 cc.  FLUIDS:                       One liter of lactated Ringers.  INDICATIONS:                  We have a 35 year old G4, P4, postpartum day #1, status post vacuum-assisted vaginal delivery who desires permanent sterilization.  FINDINGS:                     Normal uterus, tubes, and ovaries.  DESCRIPTION OF PROCEDURE:     The patient was taken to the operating room where she was intubated and general anesthesia begun.  A small transverse infraumbilical skin incision was then made with the scalpel.  The incision was carried down to the underlying fascia until the peritoneum was identified and entered.  The peritoneum was noted to be free of any adhesions and the incision was then extended with the Metzenbaum scissors.  The patients left fallopian tube was then identified, brought into the incision, grasped with a Babcock clamp, and then followed up to the fimbria.  The Babcock clamp was then used to grasp approximately 4 cm from the carinal region.  A 3 cm segment of the tube was then  ligated with a free tie of plain gut and 2-0 chromic on the SH needle.  Good hemostasis was noted and the tube was returned to the abdomen.  The right fallopian tube was then ligated and a 3 cm segment excised in a similar fashion.  Excellent hemostasis was noted and the tube returned to the abdomen.  The peritoneum was closed with 2-0 chromic on an SH needle.  The fascia was closed with 0 Vicryl.  The subcutaneous  layer was closed with 3-0 Monocryl and the skin was closed with 4-0 Vicryl.  The patient tolerated the procedure well.  Sponge, lap, and needle counts were correct x2.  The patient was taken to the recovery room in stable condition.  PATHOLOGY:                    Segments of right and left fallopian tubes. Dictated by:   Ed Blalock. Burnadette Peter, M.D. Attending Physician:  Enid Cutter DD:  12/24/01 TD:  12/25/01 Job: [id number]

## 2011-04-10 ENCOUNTER — Other Ambulatory Visit: Payer: Self-pay | Admitting: *Deleted

## 2011-04-10 DIAGNOSIS — IMO0002 Reserved for concepts with insufficient information to code with codable children: Secondary | ICD-10-CM

## 2011-04-11 MED ORDER — OXYCODONE HCL 5 MG PO CAPS: 5.0000 mg | ORAL_CAPSULE | Freq: Four times a day (QID) | ORAL | Status: DC | PRN

## 2011-04-13 MED ORDER — OXYCODONE HCL 5 MG PO CAPS: 5.0000 mg | ORAL_CAPSULE | Freq: Four times a day (QID) | ORAL | Status: DC | PRN

## 2011-05-05 ENCOUNTER — Other Ambulatory Visit: Payer: Self-pay | Admitting: *Deleted

## 2011-05-05 ENCOUNTER — Encounter: Payer: BC Managed Care – PPO | Admitting: Internal Medicine

## 2011-05-05 DIAGNOSIS — IMO0002 Reserved for concepts with insufficient information to code with codable children: Secondary | ICD-10-CM

## 2011-05-08 MED ORDER — OXYCODONE HCL 5 MG PO CAPS: 5.0000 mg | ORAL_CAPSULE | Freq: Four times a day (QID) | ORAL | Status: DC | PRN

## 2011-05-08 MED ORDER — TRAMADOL HCL 50 MG PO TABS: 50.0000 mg | ORAL_TABLET | Freq: Two times a day (BID) | ORAL | Status: DC | PRN

## 2011-05-12 ENCOUNTER — Other Ambulatory Visit: Payer: Self-pay | Admitting: Internal Medicine

## 2011-05-12 ENCOUNTER — Telehealth: Payer: Self-pay | Admitting: *Deleted

## 2011-05-12 DIAGNOSIS — IMO0002 Reserved for concepts with insufficient information to code with codable children: Secondary | ICD-10-CM

## 2011-05-12 MED ORDER — TRAMADOL HCL 50 MG PO TABS: 50.0000 mg | ORAL_TABLET | Freq: Two times a day (BID) | ORAL | Status: DC | PRN

## 2011-05-12 MED ORDER — ZOLPIDEM TARTRATE 10 MG PO TABS: 10.0000 mg | ORAL_TABLET | Freq: Every evening | ORAL | Status: DC | PRN

## 2011-05-12 MED ORDER — OXYCODONE HCL 5 MG PO CAPS: 5.0000 mg | ORAL_CAPSULE | Freq: Four times a day (QID) | ORAL | Status: DC | PRN

## 2011-05-12 NOTE — Telephone Encounter (Signed)
Victoria Ellis I am trying to print out the script but I can not put in the refill. Can you please help me with that. Thank you

## 2011-05-12 NOTE — Telephone Encounter (Signed)
reviewed

## 2011-05-24 ENCOUNTER — Encounter: Payer: BC Managed Care – PPO | Admitting: Internal Medicine

## 2011-05-31 ENCOUNTER — Encounter: Payer: Self-pay | Admitting: Internal Medicine

## 2011-05-31 ENCOUNTER — Ambulatory Visit (INDEPENDENT_AMBULATORY_CARE_PROVIDER_SITE_OTHER): Payer: BC Managed Care – PPO | Admitting: Internal Medicine

## 2011-05-31 ENCOUNTER — Encounter: Payer: BC Managed Care – PPO | Admitting: Internal Medicine

## 2011-05-31 VITALS — BP 133/85 | HR 70 | Temp 98.7°F | Ht 59.0 in | Wt 137.5 lb

## 2011-05-31 DIAGNOSIS — IMO0002 Reserved for concepts with insufficient information to code with codable children: Secondary | ICD-10-CM

## 2011-05-31 MED ORDER — OXYCODONE HCL 5 MG PO CAPS: 5.0000 mg | ORAL_CAPSULE | Freq: Four times a day (QID) | ORAL | Status: DC | PRN

## 2011-05-31 MED ORDER — TRAMADOL HCL 50 MG PO TABS: 50.0000 mg | ORAL_TABLET | Freq: Three times a day (TID) | ORAL | Status: DC | PRN

## 2011-05-31 MED ORDER — LORAZEPAM 0.5 MG PO TABS: 0.5000 mg | ORAL_TABLET | Freq: Two times a day (BID) | ORAL | Status: DC | PRN

## 2011-05-31 NOTE — Assessment & Plan Note (Signed)
Patient pain is well controlled on current medication regimen. We will provide refills today and we'll see her back in 3 months.

## 2011-05-31 NOTE — Progress Notes (Signed)
  Subjective:    Patient ID: Victoria Ellis, female    DOB: 12/10/1975, 35 y.o.   MRN: [id number]  HPI  Patient is 35 year old female with past medical history outlined below who presents to clinic for regular followup. She is denying any current concerns, no recent illnesses or hospitalizations, no episodes of chest pain or shortness of breath, no abdominal or urinary concerns. She needs refills on her pain  Review of Systems Constitutional: Denies fever, chills, diaphoresis, appetite change and fatigue.  HEENT: Denies photophobia, eye pain, redness, hearing loss, ear pain, congestion, sore throat, rhinorrhea, sneezing, mouth sores, trouble swallowing, neck pain, neck stiffness and tinnitus.   Respiratory: Denies SOB, DOE, cough, chest tightness,  and wheezing.   Cardiovascular: Denies chest pain, palpitations and leg swelling.  Gastrointestinal: Denies nausea, vomiting, abdominal pain, diarrhea, constipation, blood in stool and abdominal distention.  Genitourinary: Denies dysuria, urgency, frequency, hematuria, flank pain and difficulty urinating.  Musculoskeletal: Denies myalgias, back pain, joint swelling, arthralgias and gait problem.  Skin: Denies pallor, rash and wound.  Neurological: Denies dizziness, seizures, syncope, weakness, light-headedness, numbness and headaches.  Hematological: Denies adenopathy. Easy bruising, personal or family bleeding history  Psychiatric/Behavioral: Denies suicidal ideation, mood changes, confusion, nervousness, sleep disturbance and agitation        Objective:   Physical Exam Constitutional: Vital signs reviewed.  Patient is a well-developed and well-nourished in no acute distress and cooperative with exam. Alert and oriented x3.  Cardiovascular: RRR, S1 normal, S2 normal, no MRG, pulses symmetric and intact bilaterally Pulmonary/Chest: CTAB, no wheezes, rales, or rhonchi Abdominal: Soft. Non-tender, non-distended, bowel sounds are normal, no  masses, organomegaly, or guarding present.  Neurological: A&O x3, Strenght is normal and symmetric bilaterally, cranial nerve II-XII are grossly intact, no focal motor deficit, sensory intact to light touch bilaterally.  Skin: Warm, dry and intact. No rash, cyanosis, or clubbing.  Psychiatric: Normal mood and affect. speech and behavior is normal. Judgment and thought content normal. Cognition and memory are normal.          Assessment & Plan:

## 2011-06-30 ENCOUNTER — Other Ambulatory Visit: Payer: Self-pay | Admitting: *Deleted

## 2011-06-30 DIAGNOSIS — IMO0002 Reserved for concepts with insufficient information to code with codable children: Secondary | ICD-10-CM

## 2011-06-30 MED ORDER — OXYCODONE HCL 5 MG PO CAPS: 5.0000 mg | ORAL_CAPSULE | Freq: Four times a day (QID) | ORAL | Status: DC | PRN

## 2011-06-30 NOTE — Telephone Encounter (Signed)
Last refill 8/8 Call when ready @ [phone]

## 2011-06-30 NOTE — Telephone Encounter (Signed)
Pt informed Rx ready 

## 2011-07-05 ENCOUNTER — Other Ambulatory Visit: Payer: Self-pay | Admitting: Internal Medicine

## 2011-07-05 NOTE — Telephone Encounter (Signed)
She does not have a diagnose of insomnia in EPIC nor in Centricity.  Ambien is not a long term medication.

## 2011-07-13 LAB — DIFFERENTIAL
Lymphocytes Relative: 34
Lymphs Abs: 2.6
Neutrophils Relative %: 58

## 2011-07-13 LAB — URINALYSIS, ROUTINE W REFLEX MICROSCOPIC
Glucose, UA: NEGATIVE
Specific Gravity, Urine: 1.038 — ABNORMAL HIGH
pH: 6

## 2011-07-13 LAB — COMPREHENSIVE METABOLIC PANEL
CO2: 24
Calcium: 9.2
Creatinine, Ser: 0.63
GFR calc Af Amer: >60
GFR calc non Af Amer: >60
Glucose, Bld: 94

## 2011-07-13 LAB — CBC
Hemoglobin: 12.9
MCHC: 33.9
MCV: 89.4
RBC: 4.26
RDW: 11.8

## 2011-07-13 LAB — URINE MICROSCOPIC-ADD ON

## 2011-07-13 LAB — RAPID URINE DRUG SCREEN, HOSP PERFORMED
Amphetamines: NOT DETECTED
Opiates: POSITIVE — AB
Tetrahydrocannabinol: NOT DETECTED

## 2011-07-18 NOTE — Telephone Encounter (Signed)
She does not have a diagnose of insomnia in EPIC nor in Centricity.  Ambien is not a long term medication.   

## 2011-07-25 ENCOUNTER — Other Ambulatory Visit: Payer: Self-pay | Admitting: Internal Medicine

## 2011-07-25 DIAGNOSIS — IMO0002 Reserved for concepts with insufficient information to code with codable children: Secondary | ICD-10-CM

## 2011-07-25 MED ORDER — OXYCODONE HCL 5 MG PO CAPS: 5.0000 mg | ORAL_CAPSULE | Freq: Four times a day (QID) | ORAL | Status: DC | PRN

## 2011-07-28 ENCOUNTER — Other Ambulatory Visit: Payer: Self-pay | Admitting: *Deleted

## 2011-07-28 NOTE — Telephone Encounter (Signed)
Pain med Rx's were written for 3 months. Pt informed meds are ready

## 2011-08-31 NOTE — Telephone Encounter (Signed)
Not on med list.  She may take one of her 2 daily ativans at bedtime if she wishes or make an app't for insomnia.

## 2011-09-08 ENCOUNTER — Encounter: Payer: BC Managed Care – PPO | Admitting: Internal Medicine

## 2011-09-09 ENCOUNTER — Emergency Department (HOSPITAL_COMMUNITY)
Admission: EM | Admit: 2011-09-09 | Discharge: 2011-09-09 | Disposition: A | Discharge status: Home / Self Care | Payer: BC Managed Care – PPO | Attending: Emergency Medicine | Admitting: Emergency Medicine

## 2011-09-09 ENCOUNTER — Encounter (HOSPITAL_COMMUNITY): Payer: Self-pay

## 2011-09-09 ENCOUNTER — Other Ambulatory Visit: Payer: Self-pay

## 2011-09-09 ENCOUNTER — Emergency Department (HOSPITAL_COMMUNITY): Payer: BC Managed Care – PPO

## 2011-09-09 DIAGNOSIS — G8929 Other chronic pain: Secondary | ICD-10-CM | POA: Insufficient documentation

## 2011-09-09 DIAGNOSIS — R569 Unspecified convulsions: Secondary | ICD-10-CM | POA: Insufficient documentation

## 2011-09-09 LAB — URINE MICROSCOPIC-ADD ON

## 2011-09-09 LAB — POCT I-STAT, CHEM 8
BUN: 6 mg/dL (ref 6–23)
Calcium, Ion: 1.09 mmol/L — ABNORMAL LOW (ref 1.12–1.32)
Glucose, Bld: 122 mg/dL — ABNORMAL HIGH (ref 70–99)
TCO2: 19 mmol/L (ref 0–100)

## 2011-09-09 LAB — URINALYSIS, ROUTINE W REFLEX MICROSCOPIC
Protein, ur: NEGATIVE mg/dL
Urobilinogen, UA: 0.2 mg/dL (ref 0.0–1.0)

## 2011-09-09 LAB — PREGNANCY, URINE: Preg Test, Ur: NEGATIVE

## 2011-09-09 MED ORDER — LORAZEPAM 2 MG/ML IJ SOLN
INTRAMUSCULAR | Status: AC
  Filled 2011-09-09: qty 1

## 2011-09-09 MED ORDER — LORAZEPAM 2 MG/ML IJ SOLN
1.0000 mg | Freq: Once | INTRAMUSCULAR | Status: AC
  Administered 2011-09-09: 1 mg via INTRAVENOUS

## 2011-09-09 MED ORDER — SODIUM CHLORIDE 0.9 % IV BOLUS (SEPSIS): 1000.0000 mL | Freq: Once | INTRAVENOUS | Status: DC

## 2011-09-09 MED ORDER — SODIUM CHLORIDE 0.9 % IV BOLUS (SEPSIS)
2000.0000 mL | Freq: Once | INTRAVENOUS | Status: AC
  Administered 2011-09-09: 2000 mL via INTRAVENOUS

## 2011-09-09 NOTE — ED Notes (Signed)
Pt to cdu s/p new onset seizure. Pt awaiting ct scan results and is receiving 2L NS IV. Pt reports poor PO intake over past few days and presents tachycardic. Also c/o fatigue and head pain. Pt remains a&ox3, neuro intact, will cont to monitor.

## 2011-09-09 NOTE — ED Provider Notes (Signed)
Medical screening examination/treatment/procedure(s) were conducted as a shared visit with non-physician practitioner(s) and myself.  I personally evaluated the patient during the encounter  Hurman Horn, MD 09/09/11 [phone]

## 2011-09-09 NOTE — ED Notes (Signed)
Patient transported to CT 

## 2011-09-09 NOTE — ED Notes (Signed)
Patient transported to X-ray 

## 2011-09-09 NOTE — ED Provider Notes (Addendum)
This 35 year old female had a witnessed generalized seizure at work prior to arrival, now she has minimal headache and minimal posterior neck pain with minimal cervical spine tenderness on examination, she denies any localized neurologic symptoms, she does have some bilateral tongue biting from her seizure, she apparently was postictal and is now recovering well, there was initially a question of alcohol abuse and alcohol withdrawal seizure but now the patient is oriented and denies alcohol usage at all, she states she wasn't drinking fluids at all yesterday woke up this morning with a dry mouth and feeling dehydrated but went to work when she had her seizure, she arrives tachycardic and will be receiving IV fluids in the emergency department.  Medical screening examination/treatment/procedure(s) were conducted as a shared visit with non-physician practitioner(s) and myself.  I personally evaluated the patient during the encounter  ECG sinus tachycardia, ventricular rate 135 beats per minute, normal axis, normal intervals, nonspecific ST changes present, compared to July 2002 rate is now faster and nonspecific ST changes present.  Hurman Horn, MD 09/09/11 1030  Hurman Horn, MD 09/09/11 [phone]

## 2011-09-09 NOTE — ED Notes (Signed)
Returned from ct 

## 2011-09-09 NOTE — ED Notes (Signed)
Pt here by ems for new onset seizure, witnessed by co workers, sts grandmal in nature, lasting approx 5 mins per report and postictal on ems arrival. No oral trauma noted, no incontinence.

## 2011-09-09 NOTE — ED Provider Notes (Signed)
Medical screening examination/treatment/procedure(s) were conducted as a shared visit with non-physician practitioner(s) and myself.  I personally evaluated the patient during the encounter  Meshia Rau M Ranie Chinchilla, MD 09/09/11 1821 

## 2011-09-09 NOTE — ED Provider Notes (Signed)
11:20 AM Patient care was assumed from Dr. Fonnie Jarvis and Ellin Saba. Patient came to the ED for new onset of seizure. Plan is to rule out head trauma and C-spine, as well as hydrate the patient. It is been presumed that the seizure was due to severe dehydration. Labs and radiology pending.  12:59 PM  Normal head CT and normal cervical spine radiographs. Patient has granular casts seen in her urine. And trace leukocytes.  2:51 PM Waiting for neurology consult.   3:20 PM Spoke with Dr. Thad Ranger of neurology who recommended the patient followup with [facility] neurology associations next week. We discussed the possibility of starting the patient on anticonvulsants and have decided against it. The patient will be discharged with instructions to orally hydrate and followup.  [place], [place] 09/09/11 1520

## 2011-09-09 NOTE — ED Notes (Signed)
PA to consult neuro for pt to set up follow up appt

## 2011-09-09 NOTE — ED Provider Notes (Signed)
History     CSN: [id number] Arrival date & time: 09/09/2011  9:58 AM   First MD Initiated Contact with Patient 09/09/11 1007      Chief Complaint  Patient presents with  . Seizures    (Consider location/radiation/quality/duration/timing/severity/associated sxs/prior treatment) Patient is a 35 y.o. female presenting with seizures.  Seizures  This is a new problem. The current episode started 1 to 2 hours ago.    Pt presents by EMS from work for a new onset of seizure. Pt states that she has not drank fluids in a couple of days. Denies alcohol and drug use. Denies any previous seizures. Pt is slightly confused still upon examination. Pt does take Ultram and Benzos. Pt has bit her tongue and is having minor neck pain.  Past Medical History  Diagnosis Date  . Chronic back pain 2000      1.  Postoperative changes left hemilaminotomy L5-S1.  Marland Kitchen Chronic back pain     History reviewed. No pertinent past surgical history.  History reviewed. No pertinent family history.  History  Substance Use Topics  . Smoking status: Never Smoker   . Smokeless tobacco: Not on file  . Alcohol Use: No    OB History    Grav Para Term Preterm Abortions TAB SAB Ect Mult Living                  Review of Systems  Neurological: Positive for seizures.  All other systems reviewed and are negative.    Allergies  Review of patient's allergies indicates no known allergies.  Home Medications   Current Outpatient Rx  Name Route Sig Dispense Refill  . BENZONATATE 200 MG PO CAPS Oral Take 200 mg by mouth.    Marland Kitchen LORAZEPAM 0.5 MG PO TABS Oral Take 1 tablet (0.5 mg total) by mouth 2 (two) times daily as needed. 62 tablet 5  . OXYCODONE HCL 5 MG PO CAPS Oral Take 1 capsule (5 mg total) by mouth every 6 (six) hours as needed for pain. 90 capsule 0    Do not fill until 08/31/11  . TRAMADOL HCL 50 MG PO TABS Oral Take 1 tablet (50 mg total) by mouth every 8 (eight) hours as needed. 90 tablet 7    BP  125/76  Pulse 143  Temp(Src) 98.4 F (36.9 C) (Oral)  Resp 32  SpO2 100%  LMP 08/23/2011  Physical Exam  Nursing note and vitals reviewed. Constitutional: She appears well-developed and well-nourished.  HENT:  Head: Normocephalic.  Eyes: Conjunctivae are normal. Pupils are equal, round, and reactive to light.  Neck: Trachea normal, normal range of motion and full passive range of motion without pain. Neck supple.  Cardiovascular: Normal rate, regular rhythm and normal pulses.   Pulmonary/Chest: Effort normal and breath sounds normal. Chest wall is not dull to percussion. She exhibits no tenderness, no crepitus, no edema, no deformity and no retraction.  Abdominal: Soft. Normal appearance and bowel sounds are normal.  Musculoskeletal: Normal range of motion.  Lymphadenopathy:       Head (right side): No submental, no submandibular, no tonsillar, no preauricular, no posterior auricular and no occipital adenopathy present.       Head (left side): No submental, no submandibular, no tonsillar, no preauricular, no posterior auricular and no occipital adenopathy present.    She has no cervical adenopathy.    She has no axillary adenopathy.  Neurological: She is alert. She has normal strength.  Skin: Skin is warm, dry and  intact.  Psychiatric: She has a normal mood and affect. Her speech is normal and behavior is normal. Judgment and thought content normal. Cognition and memory are normal.    ED Course  Procedures (including critical care time)  Labs Reviewed  POCT I-STAT, CHEM 8 - Abnormal; Notable for the following:    Glucose, Bld 122 (*)    Calcium, Ion 1.09 (*)    Hemoglobin 11.9 (*)    HCT 35.0 (*)    All other components within normal limits  I-STAT, CHEM 8  URINALYSIS, ROUTINE W REFLEX MICROSCOPIC  PREGNANCY, URINE   No results found.   No diagnosis found.    MDM  Pt upon re-eval is no longer post ictal. Will move to CDU to follow-up on lab orders and  dispo.       Dorthula Matas, PA 09/09/11 1134

## 2011-09-09 NOTE — ED Notes (Signed)
Ambulated to restroom  

## 2011-09-09 NOTE — ED Notes (Signed)
Pt here for new onset seizures, started at work lasted per coworkers approx 5 mins grandmal in nature. Post ictal on ems arrival. cbg with ems 127, pt sts may be dehydrated, hr in 140's, denies pain, no oral trauma noted and no urinary incontinence noted. Pt alret and priented on arrival to ed.

## 2011-09-12 ENCOUNTER — Other Ambulatory Visit: Payer: Self-pay | Admitting: Internal Medicine

## 2011-09-13 NOTE — Telephone Encounter (Signed)
Ambien rx faxed to [facility] pharmacy.

## 2011-09-18 ENCOUNTER — Encounter: Payer: Self-pay | Admitting: Internal Medicine

## 2011-09-18 ENCOUNTER — Ambulatory Visit (INDEPENDENT_AMBULATORY_CARE_PROVIDER_SITE_OTHER): Payer: BC Managed Care – PPO | Admitting: Internal Medicine

## 2011-09-18 VITALS — BP 131/85 | HR 90 | Temp 97.7°F | Ht 59.0 in | Wt 134.4 lb

## 2011-09-18 DIAGNOSIS — R569 Unspecified convulsions: Secondary | ICD-10-CM

## 2011-09-18 NOTE — Patient Instructions (Addendum)
It was nice to meet you, Victoria Ellis.  I have referred you to [facility] to assess your new onset seizures. Continue to take your medications as prescribed. Return to this clinic for a follow-up in 1-2 months.

## 2011-09-18 NOTE — Assessment & Plan Note (Signed)
The patient experienced new onset seizure 09/09/2011 thought to be secondary to dehydration. Presents today for referral. Neurologic exam was nonfocal today. She is without complaints. Patient to return to clinic in one to 2 months for followup of neurology consult with [facility].

## 2011-09-18 NOTE — Progress Notes (Signed)
  Subjective:    Patient ID: Victoria Ellis, female    DOB: 08/24/1976, 35 y.o.   MRN: [id number]  HPI  [name] presents for post emergency room followup for new onset seizures considered to be secondary to dehydration. Dr. Thad Ranger of [facility] neurology was consulted via telephone while patient was in the ER and recommended outpatient followup. Patient is here for referral. She reports feeling at her normal baseline. Without further seizures, presyncope, nausea, vomiting, or cold-like symptoms.  Review of Systems    as per history of present illness otherwise negative Objective:   Physical Exam  Constitutional: She is oriented to person, place, and time. She appears well-developed and well-nourished. No distress.  HENT:  Head: Normocephalic and atraumatic.  Eyes: Conjunctivae and EOM are normal. Pupils are equal, round, and reactive to light.  Neck: Normal range of motion. Neck supple.  Cardiovascular: Normal rate, regular rhythm, normal heart sounds and intact distal pulses.   No murmur heard. Pulmonary/Chest: Effort normal and breath sounds normal.  Neurological: She is alert and oriented to person, place, and time. No cranial nerve deficit.  Skin: Skin is warm and dry.  Psychiatric: Her speech is normal and behavior is normal. Judgment and thought content normal. Her affect is blunt.          Assessment & Plan:

## 2011-09-28 ENCOUNTER — Telehealth: Payer: Self-pay | Admitting: *Deleted

## 2011-09-28 NOTE — Telephone Encounter (Signed)
Patient must come in tomorrow, Dec.7, for UDS.  Do not refill opiate until she gives sample. Ask me to re-consider refill when she comes in tomorrow.

## 2011-10-02 MED ORDER — OXYCODONE HCL 15 MG PO TB12: 15.0000 mg | ORAL_TABLET | Freq: Two times a day (BID) | ORAL | Status: DC

## 2011-10-02 NOTE — Telephone Encounter (Signed)
Printed and handed to [employer]

## 2011-10-05 ENCOUNTER — Encounter: Payer: Self-pay | Admitting: *Deleted

## 2011-10-05 NOTE — Telephone Encounter (Signed)
It was noted when i was signing script out it was for a previous med, that script from the 10th was destroyed and the correct one given- 5mg  oxy instead of 15 mg, she ask if i wanted her urine sample and stated no, script was given

## 2011-10-05 NOTE — Progress Notes (Signed)
I agree that it does not make much sense to have her provide a urine sample if she has had a week to "prepare" for the test.  It would be a waste of money as it would not definitively answer the question either way.  Will order a urine drug screening at a later random time and give her her prescription at this time.

## 2011-10-05 NOTE — Progress Notes (Signed)
Pt presents this am stating she needs to give a urine sample... This was ordered on the 12/6-7 refill request and then must have been told she would need to give a sample, now it has been almost a we still collect a sample??? .

## 2011-10-25 ENCOUNTER — Other Ambulatory Visit: Payer: Self-pay | Admitting: Internal Medicine

## 2011-11-06 ENCOUNTER — Other Ambulatory Visit: Payer: Self-pay | Admitting: Internal Medicine

## 2011-11-06 ENCOUNTER — Other Ambulatory Visit: Payer: Self-pay | Admitting: *Deleted

## 2011-11-06 DIAGNOSIS — IMO0002 Reserved for concepts with insufficient information to code with codable children: Secondary | ICD-10-CM

## 2011-11-06 MED ORDER — OXYCODONE HCL 5 MG PO CAPS: 5.0000 mg | ORAL_CAPSULE | Freq: Four times a day (QID) | ORAL | Status: DC | PRN

## 2011-11-06 MED ORDER — OXYCODONE HCL 15 MG PO TB12: 15.0000 mg | ORAL_TABLET | Freq: Two times a day (BID) | ORAL | Status: DC

## 2011-11-06 NOTE — Progress Notes (Signed)
Incorrect refill request sent from triage. Oxycontin prescription was NOT given to patient.

## 2011-12-02 ENCOUNTER — Other Ambulatory Visit: Payer: Self-pay | Admitting: Internal Medicine

## 2011-12-04 ENCOUNTER — Other Ambulatory Visit: Payer: Self-pay | Admitting: *Deleted

## 2011-12-04 DIAGNOSIS — G8929 Other chronic pain: Secondary | ICD-10-CM

## 2011-12-05 NOTE — Telephone Encounter (Signed)
1.)  I cannot figure out how she changed from oxyIR 5 tid to oxycontin 15 bid.  This seems to have happened sometime in  the past 2-3 months.  There is no office visit to explain this.  2.)  She was to have had a UDS in late 2012 but it somehow was canceled.  She has not had one in over a year, and that one had cocaine on it.  She should be called to come in for this with no advanced notice.  3.)  Please call her to come in right away in person for her refill, and get a fresh urine when she comes.  If this goes without a hitch, I will write for the correct opiate.

## 2011-12-05 NOTE — Telephone Encounter (Signed)
Pt coming in tomorrow morning.  Please put in labs.

## 2011-12-06 ENCOUNTER — Other Ambulatory Visit: Payer: Self-pay | Admitting: *Deleted

## 2011-12-06 ENCOUNTER — Other Ambulatory Visit: Payer: BC Managed Care – PPO

## 2011-12-06 DIAGNOSIS — IMO0002 Reserved for concepts with insufficient information to code with codable children: Secondary | ICD-10-CM

## 2011-12-06 DIAGNOSIS — M549 Dorsalgia, unspecified: Secondary | ICD-10-CM

## 2011-12-06 MED ORDER — OXYCODONE HCL 5 MG PO CAPS: 5.0000 mg | ORAL_CAPSULE | Freq: Four times a day (QID) | ORAL | Status: DC | PRN

## 2011-12-07 LAB — PRESCRIPTION ABUSE MONITORING 15P, URINE
Barbiturate Screen, Urine: NEGATIVE ng/mL
Benzodiazepine Screen, Urine: NEGATIVE ng/mL
Buprenorphine, Urine: NEGATIVE ng/mL
Carisoprodol, Urine: NEGATIVE ng/mL
Meperidine, Ur: NEGATIVE ng/mL
Tramadol Scrn, Ur: POSITIVE ng/mL — ABNORMAL HIGH

## 2011-12-08 LAB — TRAMADOL, URINE
N-DESMETHYL-CIS-TRAMADOL: 18700 NG/ML — ABNORMAL HIGH
Tramadol, Urine: 62200 NG/ML — ABNORMAL HIGH

## 2011-12-08 LAB — OPIATES/OPIOIDS (LC/MS-MS)
Morphine Urine: NEGATIVE NG/ML
Oxycodone, ur: 1390 NG/ML — ABNORMAL HIGH

## 2011-12-18 ENCOUNTER — Other Ambulatory Visit: Payer: Self-pay | Admitting: Internal Medicine

## 2011-12-20 ENCOUNTER — Encounter: Payer: Self-pay | Admitting: Internal Medicine

## 2011-12-20 ENCOUNTER — Ambulatory Visit (INDEPENDENT_AMBULATORY_CARE_PROVIDER_SITE_OTHER): Payer: BC Managed Care – PPO | Admitting: Internal Medicine

## 2011-12-20 ENCOUNTER — Other Ambulatory Visit: Payer: Self-pay | Admitting: Internal Medicine

## 2011-12-20 DIAGNOSIS — F411 Generalized anxiety disorder: Secondary | ICD-10-CM

## 2011-12-20 DIAGNOSIS — F419 Anxiety disorder, unspecified: Secondary | ICD-10-CM

## 2011-12-20 DIAGNOSIS — IMO0002 Reserved for concepts with insufficient information to code with codable children: Secondary | ICD-10-CM

## 2011-12-20 DIAGNOSIS — R569 Unspecified convulsions: Secondary | ICD-10-CM

## 2011-12-20 NOTE — Assessment & Plan Note (Signed)
Stable on Oxycodone prescribed per Dr. Aundria Rud and Magick.  Pt provided urine sample today which was positive for oxyco.  No refill provided today.
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
## 2011-12-20 NOTE — Assessment & Plan Note (Signed)
Seen by Dr. Terrace Arabia of [facility], neg CT of head and ECT per pt report.  Pt reports family h/o seizures with brother and sister and now most recently pt 34 yo daughter diagnosed and on "seizure medication".  No more episodes since ED presentation in November 2012.
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
## 2011-12-25 NOTE — Progress Notes (Signed)
This encounter was created in error - please disregard.

## 2012-01-01 ENCOUNTER — Other Ambulatory Visit: Payer: Self-pay | Admitting: *Deleted

## 2012-01-01 DIAGNOSIS — IMO0002 Reserved for concepts with insufficient information to code with codable children: Secondary | ICD-10-CM

## 2012-01-01 NOTE — Telephone Encounter (Signed)
Pt states the incease of 100 mg of the sertraline (2 tabs) is not helping her anxiety and she is not sleeping well. Can you give her something else. Pt # [id number] Last refill of oxycodone 2/13

## 2012-01-02 ENCOUNTER — Other Ambulatory Visit: Payer: Self-pay | Admitting: Internal Medicine

## 2012-01-02 DIAGNOSIS — F419 Anxiety disorder, unspecified: Secondary | ICD-10-CM

## 2012-01-02 DIAGNOSIS — F329 Major depressive disorder, single episode, unspecified: Secondary | ICD-10-CM

## 2012-01-02 MED ORDER — SERTRALINE HCL 50 MG PO TABS: 125.0000 mg | ORAL_TABLET | Freq: Every day | ORAL | Status: DC

## 2012-01-02 MED ORDER — OXYCODONE HCL 5 MG PO CAPS: 5.0000 mg | ORAL_CAPSULE | Freq: Four times a day (QID) | ORAL | Status: DC | PRN

## 2012-01-02 NOTE — Telephone Encounter (Signed)
For Ms. Chadderdon that should be 2 and a half pills for total dose of 125 mg qhs.

## 2012-01-02 NOTE — Telephone Encounter (Signed)
Ms. Paterson should increase dose of Zoloft to 150 mg at bed time.  This would be 2 and half tablets to help with anxiety and sleep. Please call patient to let her know that I will contact pharmacy to change prescription.

## 2012-01-03 NOTE — Telephone Encounter (Signed)
Pharmacy informed.

## 2012-01-04 NOTE — Telephone Encounter (Signed)
Pt informed that script was ready and new dosing of zoloft

## 2012-01-17 ENCOUNTER — Other Ambulatory Visit: Payer: Self-pay | Admitting: Internal Medicine

## 2012-01-31 ENCOUNTER — Other Ambulatory Visit: Payer: Self-pay | Admitting: *Deleted

## 2012-01-31 DIAGNOSIS — IMO0002 Reserved for concepts with insufficient information to code with codable children: Secondary | ICD-10-CM

## 2012-01-31 MED ORDER — OXYCODONE HCL 5 MG PO CAPS: 5.0000 mg | ORAL_CAPSULE | Freq: Four times a day (QID) | ORAL | Status: DC | PRN

## 2012-01-31 NOTE — Telephone Encounter (Signed)
Last refill 3/11 # 90 Call when ready @ [phone]

## 2012-02-02 NOTE — Telephone Encounter (Signed)
Pt informed Rx is ready 

## 2012-03-02 ENCOUNTER — Other Ambulatory Visit: Payer: Self-pay | Admitting: Internal Medicine

## 2012-03-04 ENCOUNTER — Other Ambulatory Visit: Payer: Self-pay | Admitting: *Deleted

## 2012-03-04 DIAGNOSIS — IMO0002 Reserved for concepts with insufficient information to code with codable children: Secondary | ICD-10-CM

## 2012-03-04 NOTE — Telephone Encounter (Signed)
Last filled 4/10 Call when ready @ [phone]

## 2012-03-05 ENCOUNTER — Other Ambulatory Visit: Payer: Self-pay | Admitting: *Deleted

## 2012-03-05 MED ORDER — OXYCODONE HCL 5 MG PO CAPS: 5.0000 mg | ORAL_CAPSULE | Freq: Four times a day (QID) | ORAL | Status: DC | PRN

## 2012-03-05 MED ORDER — TRAMADOL HCL 50 MG PO TABS: 50.0000 mg | ORAL_TABLET | Freq: Three times a day (TID) | ORAL | Status: DC | PRN

## 2012-03-05 NOTE — Telephone Encounter (Signed)
Pt informed and will schedule appointment 

## 2012-03-05 NOTE — Telephone Encounter (Signed)
Pt needs to see Dr Bosie Clos in Aug. I refilled May, June, and July's Rx. Pls ask her to make appt PRIOR to needing her Aug's refill.

## 2012-04-04 ENCOUNTER — Other Ambulatory Visit: Payer: Self-pay | Admitting: *Deleted

## 2012-04-08 MED ORDER — TRAMADOL HCL 50 MG PO TABS: 50.0000 mg | ORAL_TABLET | Freq: Three times a day (TID) | ORAL | Status: DC | PRN

## 2012-04-22 ENCOUNTER — Ambulatory Visit (HOSPITAL_COMMUNITY)
Admission: RE | Admit: 2012-04-22 | Discharge: 2012-04-22 | Disposition: A | Discharge status: Home / Self Care | Payer: Medicaid Other | Source: Ambulatory Visit | Attending: Internal Medicine | Admitting: Internal Medicine

## 2012-04-22 ENCOUNTER — Ambulatory Visit (INDEPENDENT_AMBULATORY_CARE_PROVIDER_SITE_OTHER): Payer: BC Managed Care – PPO | Admitting: Internal Medicine

## 2012-04-22 ENCOUNTER — Encounter: Payer: Self-pay | Admitting: Internal Medicine

## 2012-04-22 VITALS — BP 125/75 | HR 73 | Temp 98.4°F | Ht 59.0 in | Wt 133.5 lb

## 2012-04-22 DIAGNOSIS — R002 Palpitations: Secondary | ICD-10-CM | POA: Insufficient documentation

## 2012-04-22 DIAGNOSIS — F419 Anxiety disorder, unspecified: Secondary | ICD-10-CM

## 2012-04-22 DIAGNOSIS — G5790 Unspecified mononeuropathy of unspecified lower limb: Secondary | ICD-10-CM

## 2012-04-22 DIAGNOSIS — IMO0002 Reserved for concepts with insufficient information to code with codable children: Secondary | ICD-10-CM

## 2012-04-22 DIAGNOSIS — F410 Panic disorder [episodic paroxysmal anxiety] without agoraphobia: Secondary | ICD-10-CM

## 2012-04-22 DIAGNOSIS — F329 Major depressive disorder, single episode, unspecified: Secondary | ICD-10-CM

## 2012-04-22 LAB — CBC
Hemoglobin: 12.8 g/dL (ref 12.0–15.0)
MCH: 29.4 pg (ref 26.0–34.0)
MCV: 85.5 fL (ref 78.0–100.0)
RBC: 4.35 MIL/uL (ref 3.87–5.11)

## 2012-04-22 MED ORDER — SERTRALINE HCL 50 MG PO TABS: 150.0000 mg | ORAL_TABLET | Freq: Every day | ORAL | Status: DC

## 2012-04-22 MED ORDER — OXYCODONE HCL 5 MG PO CAPS: 5.0000 mg | ORAL_CAPSULE | Freq: Four times a day (QID) | ORAL | Status: DC | PRN
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
## 2012-04-22 NOTE — Patient Instructions (Addendum)
I would like you to be evaluated by Physical Therapy to help with your back and lower leg pain. I will refill your Oxy-IR today.  For your panic attacks, please increase the Zoloft (Sertraline) to 3 pills a day. Keep your appointment with [facility] for tomorrow at 8:15am  We will check several blood work tests today and let you know if any are abnormal. I would like you to follow-up with your back surgeon, as well. Make an appointment to see me after you have been to physical therapy, the psychiatrist and the back surgeon.

## 2012-04-23 LAB — VITAMIN B12: Vitamin B-12: 310 pg/mL (ref 211–911)

## 2012-04-23 LAB — BASIC METABOLIC PANEL
BUN: 11 mg/dL (ref 6–23)
CO2: 27 mEq/L (ref 19–32)
Calcium: 9 mg/dL (ref 8.4–10.5)
Creat: 0.82 mg/dL (ref 0.50–1.10)
Glucose, Bld: 79 mg/dL (ref 70–99)

## 2012-05-09 ENCOUNTER — Other Ambulatory Visit: Payer: Self-pay | Admitting: *Deleted

## 2012-05-09 MED ORDER — TRAMADOL HCL 50 MG PO TABS: 50.0000 mg | ORAL_TABLET | Freq: Three times a day (TID) | ORAL | Status: DC | PRN

## 2012-05-13 NOTE — Addendum Note (Signed)
Addended by: Neomia Dear on: 05/13/2012 07:59 PM   Modules accepted: Orders

## 2012-05-15 ENCOUNTER — Ambulatory Visit: Payer: Medicaid Other | Attending: Internal Medicine | Admitting: Rehabilitation

## 2012-05-15 DIAGNOSIS — IMO0001 Reserved for inherently not codable concepts without codable children: Secondary | ICD-10-CM | POA: Insufficient documentation

## 2012-05-15 DIAGNOSIS — M2550 Pain in unspecified joint: Secondary | ICD-10-CM | POA: Insufficient documentation

## 2012-05-15 DIAGNOSIS — M2569 Stiffness of other specified joint, not elsewhere classified: Secondary | ICD-10-CM | POA: Insufficient documentation

## 2012-05-15 DIAGNOSIS — M6281 Muscle weakness (generalized): Secondary | ICD-10-CM | POA: Insufficient documentation

## 2012-05-16 ENCOUNTER — Ambulatory Visit: Payer: Medicaid Other | Admitting: Rehabilitation

## 2012-05-22 ENCOUNTER — Other Ambulatory Visit: Payer: Self-pay | Admitting: *Deleted

## 2012-05-22 ENCOUNTER — Other Ambulatory Visit: Payer: Self-pay | Admitting: Internal Medicine

## 2012-05-22 DIAGNOSIS — IMO0002 Reserved for concepts with insufficient information to code with codable children: Secondary | ICD-10-CM

## 2012-05-22 DIAGNOSIS — G5790 Unspecified mononeuropathy of unspecified lower limb: Secondary | ICD-10-CM

## 2012-05-22 MED ORDER — OXYCODONE HCL 5 MG PO CAPS: 5.0000 mg | ORAL_CAPSULE | Freq: Four times a day (QID) | ORAL | Status: DC | PRN

## 2012-05-22 NOTE — Telephone Encounter (Signed)
Last refill 7/1 Pt having dental surgery this Friday.  She is having 5 teeth extracted.  She is asking if she needs any additional pain meds, as she has a pain contract with [place]. DDS Dr Perlie Gold # [phone]  Pt # [phone]

## 2012-05-22 NOTE — Telephone Encounter (Signed)
Pt informed rx is ready.  

## 2012-05-22 NOTE — Telephone Encounter (Signed)
Dr Tennis Must office called and informed what pain meds pt is taking and may prescribe other meds for pain control if needed. Per Dr Bosie Clos

## 2012-05-23 ENCOUNTER — Ambulatory Visit: Payer: Medicaid Other | Attending: Internal Medicine | Admitting: Rehabilitation

## 2012-05-23 DIAGNOSIS — M2550 Pain in unspecified joint: Secondary | ICD-10-CM | POA: Insufficient documentation

## 2012-05-23 DIAGNOSIS — M2569 Stiffness of other specified joint, not elsewhere classified: Secondary | ICD-10-CM | POA: Insufficient documentation

## 2012-05-23 DIAGNOSIS — M6281 Muscle weakness (generalized): Secondary | ICD-10-CM | POA: Insufficient documentation

## 2012-05-23 DIAGNOSIS — IMO0001 Reserved for inherently not codable concepts without codable children: Secondary | ICD-10-CM | POA: Insufficient documentation

## 2012-05-30 ENCOUNTER — Ambulatory Visit: Payer: Medicaid Other | Admitting: Rehabilitation

## 2012-06-06 ENCOUNTER — Ambulatory Visit: Payer: Medicaid Other | Admitting: Rehabilitation

## 2012-06-10 ENCOUNTER — Encounter: Payer: Medicaid Other | Admitting: Internal Medicine

## 2012-06-17 ENCOUNTER — Ambulatory Visit (INDEPENDENT_AMBULATORY_CARE_PROVIDER_SITE_OTHER): Payer: Medicaid Other | Admitting: Internal Medicine

## 2012-06-17 ENCOUNTER — Encounter: Payer: Self-pay | Admitting: Internal Medicine

## 2012-06-17 VITALS — BP 132/81 | HR 67 | Temp 97.1°F | Ht 59.0 in | Wt 127.9 lb

## 2012-06-17 DIAGNOSIS — IMO0002 Reserved for concepts with insufficient information to code with codable children: Secondary | ICD-10-CM

## 2012-06-17 DIAGNOSIS — F410 Panic disorder [episodic paroxysmal anxiety] without agoraphobia: Secondary | ICD-10-CM

## 2012-06-17 DIAGNOSIS — G5790 Unspecified mononeuropathy of unspecified lower limb: Secondary | ICD-10-CM

## 2012-06-17 DIAGNOSIS — F419 Anxiety disorder, unspecified: Secondary | ICD-10-CM

## 2012-06-17 DIAGNOSIS — M216X9 Other acquired deformities of unspecified foot: Secondary | ICD-10-CM

## 2012-06-17 DIAGNOSIS — M21372 Foot drop, left foot: Secondary | ICD-10-CM

## 2012-06-17 MED ORDER — OXYCODONE HCL 5 MG PO CAPS: 5.0000 mg | ORAL_CAPSULE | Freq: Four times a day (QID) | ORAL | Status: DC | PRN

## 2012-06-17 NOTE — Patient Instructions (Signed)
Continue to take your medications as prescribed. I have refilled the Oxycodone to cover a month at every 6 hours. Continue with physical therapy and pick up the leg brace as soon as possible. I will see you back in 6 months.

## 2012-06-17 NOTE — Progress Notes (Signed)
  Subjective:    Patient ID: Victoria Ellis, female    DOB: 02-06-1976, 36 y.o.   MRN: [id number]  HPI Victoria Ellis presents for followup of her anxiety, back pain and left foot drop. I saw Victoria Ellis the beginning of July and referred her to physical therapy or evaluation of her left lower extremity pain and neurological findings. I also referred her for psychiatric evaluation for her increased anxiety and panic attacks. As well also advised her to follow up with her prior back surgeon Dr. August Saucer. The patient reports that she has been doing very well. She is learning new coping techniques with a counselor in [employer]. Physical therapy is going well and she is learning techniques to build up her muscle strength and prevent falls secondary to her left foot drop. She also was reevaluated by Dr. August Saucer her orthopedic back surgeon who states that it is too risky to repeat surgery. He has recommended a leg brace of which she states she is picking up today. 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  Of note she has stopped taking the Ambien which made her hallucinate.   Review of Systems  Constitutional: Negative for fatigue.  Eyes: Negative for visual disturbance.  Respiratory: Negative for shortness of breath.   Cardiovascular: Negative for chest pain.  Musculoskeletal: Positive for back pain.  Neurological: Negative for dizziness and weakness.  Psychiatric/Behavioral: Negative for confusion, dysphoric mood and agitation.       Objective:   Physical Exam  Constitutional: She is oriented to person, place, and time. She appears well-developed and well-nourished. No distress.  HENT:  Head: Normocephalic and atraumatic.  Eyes: Conjunctivae and EOM are normal. Pupils are equal, round, and reactive to light.  Neck: Normal range of motion. Neck supple.  Cardiovascular: Normal rate, regular rhythm, normal heart sounds and intact distal  pulses.   Pulmonary/Chest: Effort normal and breath sounds normal.  Abdominal: Soft. Bowel sounds are normal.  Musculoskeletal: Normal range of motion. She exhibits no edema and no tenderness.  Neurological: She is alert and oriented to person, place, and time. She has normal reflexes. No cranial nerve deficit. Coordination normal.  Skin: Skin is warm and dry.  Psychiatric: She has a normal mood and affect. Her behavior is normal. Judgment and thought content normal.          Assessment & Plan:  1. Lumbar radiculopathy: managed with oxycodine 5 mg, pain contract in place, refill given today with increase in #tabs to cover 30 days, recently eval by Dr. August Saucer of Ortho 2. ANxiety/panic attacks: better controlled, pt looks great today and in good spirits, very grateful for prior recommendations 3. Foot-drop: cont physical therapy and start using foot brace

## 2012-07-17 ENCOUNTER — Other Ambulatory Visit: Payer: Self-pay | Admitting: *Deleted

## 2012-07-17 DIAGNOSIS — G5790 Unspecified mononeuropathy of unspecified lower limb: Secondary | ICD-10-CM

## 2012-07-17 DIAGNOSIS — IMO0002 Reserved for concepts with insufficient information to code with codable children: Secondary | ICD-10-CM

## 2012-07-18 ENCOUNTER — Other Ambulatory Visit: Payer: Self-pay | Admitting: Internal Medicine

## 2012-07-18 DIAGNOSIS — G5790 Unspecified mononeuropathy of unspecified lower limb: Secondary | ICD-10-CM

## 2012-07-18 DIAGNOSIS — IMO0002 Reserved for concepts with insufficient information to code with codable children: Secondary | ICD-10-CM

## 2012-07-18 MED ORDER — OXYCODONE HCL 5 MG PO CAPS: 5.0000 mg | ORAL_CAPSULE | Freq: Four times a day (QID) | ORAL | Status: DC | PRN

## 2012-07-18 NOTE — Telephone Encounter (Signed)
I have printed refills for Oct, Nov, and December 2013.

## 2012-07-22 ENCOUNTER — Telehealth: Payer: Self-pay | Admitting: *Deleted

## 2012-07-22 NOTE — Telephone Encounter (Signed)
Returned pt's call - rx ready for pick-up; no answer, message left.

## 2012-07-26 NOTE — Telephone Encounter (Signed)
Pt aware Rx is ready. 

## 2012-08-26 ENCOUNTER — Other Ambulatory Visit: Payer: Self-pay | Admitting: *Deleted

## 2012-08-26 DIAGNOSIS — G5790 Unspecified mononeuropathy of unspecified lower limb: Secondary | ICD-10-CM

## 2012-08-26 DIAGNOSIS — IMO0002 Reserved for concepts with insufficient information to code with codable children: Secondary | ICD-10-CM

## 2012-08-27 MED ORDER — OXYCODONE HCL 5 MG PO CAPS: 5.0000 mg | ORAL_CAPSULE | Freq: Four times a day (QID) | ORAL | Status: DC | PRN

## 2012-09-02 ENCOUNTER — Other Ambulatory Visit: Payer: Self-pay | Admitting: Internal Medicine

## 2012-09-02 DIAGNOSIS — IMO0002 Reserved for concepts with insufficient information to code with codable children: Secondary | ICD-10-CM

## 2012-09-02 DIAGNOSIS — G5790 Unspecified mononeuropathy of unspecified lower limb: Secondary | ICD-10-CM

## 2012-09-02 MED ORDER — OXYCODONE HCL 5 MG PO CAPS: 5.0000 mg | ORAL_CAPSULE | Freq: Four times a day (QID) | ORAL | Status: DC | PRN

## 2012-09-02 NOTE — Telephone Encounter (Signed)
Dr Bosie Clos aware Rivka Brigette and I could never find Rx dated 08/27/12 for pain med. Pt has Rx to be filled 09/17/12 - has been out of pain med for a week.  Stanton Kidney [place] Stegemann RN 09/02/12 3:30PM

## 2012-10-18 ENCOUNTER — Other Ambulatory Visit: Payer: Self-pay | Admitting: *Deleted

## 2012-10-18 DIAGNOSIS — IMO0002 Reserved for concepts with insufficient information to code with codable children: Secondary | ICD-10-CM

## 2012-10-18 DIAGNOSIS — G5790 Unspecified mononeuropathy of unspecified lower limb: Secondary | ICD-10-CM

## 2012-10-18 NOTE — Telephone Encounter (Signed)
Please check amt

## 2012-10-19 MED ORDER — OXYCODONE HCL 5 MG PO CAPS: 5.0000 mg | ORAL_CAPSULE | Freq: Four times a day (QID) | ORAL | Status: DC | PRN

## 2012-10-21 ENCOUNTER — Other Ambulatory Visit: Payer: Self-pay | Admitting: Internal Medicine

## 2012-10-21 DIAGNOSIS — G5790 Unspecified mononeuropathy of unspecified lower limb: Secondary | ICD-10-CM

## 2012-10-21 DIAGNOSIS — IMO0002 Reserved for concepts with insufficient information to code with codable children: Secondary | ICD-10-CM

## 2012-10-21 MED ORDER — OXYCODONE HCL 5 MG PO CAPS: 5.0000 mg | ORAL_CAPSULE | Freq: Four times a day (QID) | ORAL | Status: DC | PRN

## 2012-11-20 ENCOUNTER — Other Ambulatory Visit: Payer: Self-pay | Admitting: *Deleted

## 2012-11-20 DIAGNOSIS — IMO0002 Reserved for concepts with insufficient information to code with codable children: Secondary | ICD-10-CM

## 2012-11-20 DIAGNOSIS — G5790 Unspecified mononeuropathy of unspecified lower limb: Secondary | ICD-10-CM

## 2012-11-20 MED ORDER — OXYCODONE HCL 5 MG PO CAPS: 5.0000 mg | ORAL_CAPSULE | Freq: Four times a day (QID) | ORAL | Status: DC | PRN

## 2012-12-23 ENCOUNTER — Other Ambulatory Visit: Payer: Self-pay | Admitting: Internal Medicine

## 2012-12-24 ENCOUNTER — Other Ambulatory Visit: Payer: Self-pay | Admitting: *Deleted

## 2012-12-24 DIAGNOSIS — IMO0002 Reserved for concepts with insufficient information to code with codable children: Secondary | ICD-10-CM

## 2012-12-26 MED ORDER — OXYCODONE HCL 5 MG PO CAPS: 5.0000 mg | ORAL_CAPSULE | Freq: Four times a day (QID) | ORAL | Status: DC | PRN

## 2012-12-26 NOTE — Telephone Encounter (Signed)
Called pt, left message for rtc 

## 2013-01-20 ENCOUNTER — Encounter: Payer: Self-pay | Admitting: Internal Medicine

## 2013-01-20 ENCOUNTER — Ambulatory Visit (INDEPENDENT_AMBULATORY_CARE_PROVIDER_SITE_OTHER): Payer: Medicaid Other | Admitting: Internal Medicine

## 2013-01-20 VITALS — BP 133/88 | HR 93 | Temp 98.9°F | Ht <= 58 in | Wt 114.6 lb

## 2013-01-20 DIAGNOSIS — F410 Panic disorder [episodic paroxysmal anxiety] without agoraphobia: Secondary | ICD-10-CM

## 2013-01-20 DIAGNOSIS — M216X9 Other acquired deformities of unspecified foot: Secondary | ICD-10-CM

## 2013-01-20 DIAGNOSIS — M21372 Foot drop, left foot: Secondary | ICD-10-CM

## 2013-01-20 DIAGNOSIS — IMO0002 Reserved for concepts with insufficient information to code with codable children: Secondary | ICD-10-CM

## 2013-01-20 NOTE — Progress Notes (Signed)
  Subjective:    Patient ID: Victoria Ellis, female    DOB: 12-09-1975, 37 y.o.   MRN: [id number]  HPI Pt with h/o lumbosacral back pain with left foot drop managed with long-term oxycodone, s/p remote surgery per Dr. August Saucer (Ortho) and panic anxiety disorder. Pt has been doing well since last visit.  Is utilizing leg brace, f/u with psych and reports no longer having panic attacks.  She is still enrolled at [employer] for [employer] and intends to transfer to [employer] in 1 year. Pt is without complaints.   Review of Systems As per HPI    Objective:   Physical Exam  Constitutional: She is oriented to person, place, and time. She appears well-developed and well-nourished.  Pleasant White female, youngest daughter present on exam  HENT:  Head: Normocephalic and atraumatic.  Eyes: Conjunctivae and EOM are normal. Pupils are equal, round, and reactive to light.  Neck: Normal range of motion. Neck supple. No thyromegaly present.  Cardiovascular: Normal rate, regular rhythm and normal heart sounds.   Pulmonary/Chest: Effort normal and breath sounds normal.  Abdominal: Soft. Bowel sounds are normal.  Musculoskeletal:       Left ankle: She exhibits normal range of motion.  Decrease strength LLE 2-3/5  Neurological: She is alert and oriented to person, place, and time.  Skin: Skin is warm and dry.     Large forearm tattoos   Psychiatric: She has a normal mood and affect.          Assessment & Plan:  1. Lumbar radiculopathy with left foot drop: stable with leg brace and oxycodone 5 mg q6h prn pain  2. Panic anxiety: pt reports resolution with zoloft and counseling

## 2013-01-20 NOTE — Patient Instructions (Signed)
I am glad to hear that your panic attacks have stopped and the leg brace is working well for you. I will give you the pain medicine prescription in hand today which can be refilled as scheduled. See me back in 1 year unless you need to see me earlier. Good luck with college!

## 2013-01-23 ENCOUNTER — Other Ambulatory Visit: Payer: Self-pay | Admitting: Internal Medicine

## 2013-01-29 ENCOUNTER — Encounter: Payer: Self-pay | Admitting: Internal Medicine

## 2013-02-24 ENCOUNTER — Other Ambulatory Visit: Payer: Self-pay | Admitting: Internal Medicine

## 2013-02-24 DIAGNOSIS — IMO0002 Reserved for concepts with insufficient information to code with codable children: Secondary | ICD-10-CM

## 2013-02-25 ENCOUNTER — Other Ambulatory Visit: Payer: Self-pay | Admitting: Internal Medicine

## 2013-02-25 ENCOUNTER — Other Ambulatory Visit: Payer: Self-pay | Admitting: *Deleted

## 2013-02-25 DIAGNOSIS — G5792 Unspecified mononeuropathy of left lower limb: Secondary | ICD-10-CM

## 2013-02-25 DIAGNOSIS — IMO0002 Reserved for concepts with insufficient information to code with codable children: Secondary | ICD-10-CM

## 2013-02-25 MED ORDER — OXYCODONE HCL 5 MG PO CAPS: 5.0000 mg | ORAL_CAPSULE | Freq: Four times a day (QID) | ORAL | Status: DC | PRN

## 2013-02-25 NOTE — Telephone Encounter (Signed)
Victoria Ellis should have scripts for Oxycodone on file.

## 2013-02-25 NOTE — Progress Notes (Signed)
Written refills put in file for May, June, July to be filled after the 8th of each month.

## 2013-02-27 NOTE — Telephone Encounter (Signed)
review 

## 2013-03-18 ENCOUNTER — Other Ambulatory Visit: Payer: Self-pay | Admitting: Internal Medicine

## 2013-03-24 ENCOUNTER — Other Ambulatory Visit: Payer: Self-pay | Admitting: *Deleted

## 2013-03-24 DIAGNOSIS — G5792 Unspecified mononeuropathy of left lower limb: Secondary | ICD-10-CM

## 2013-03-24 DIAGNOSIS — IMO0002 Reserved for concepts with insufficient information to code with codable children: Secondary | ICD-10-CM

## 2013-03-24 MED ORDER — OXYCODONE HCL 5 MG PO CAPS: 5.0000 mg | ORAL_CAPSULE | Freq: Four times a day (QID) | ORAL | Status: DC | PRN

## 2013-03-28 ENCOUNTER — Ambulatory Visit (INDEPENDENT_AMBULATORY_CARE_PROVIDER_SITE_OTHER): Payer: Medicaid Other | Admitting: Internal Medicine

## 2013-03-28 ENCOUNTER — Emergency Department (HOSPITAL_COMMUNITY)
Admission: EM | Admit: 2013-03-28 | Discharge: 2013-03-28 | Disposition: A | Discharge status: Home / Self Care | Payer: Medicaid Other | Attending: Emergency Medicine | Admitting: Emergency Medicine

## 2013-03-28 ENCOUNTER — Encounter (HOSPITAL_COMMUNITY): Payer: Self-pay | Admitting: *Deleted

## 2013-03-28 ENCOUNTER — Emergency Department (HOSPITAL_COMMUNITY): Payer: Medicaid Other

## 2013-03-28 VITALS — BP 135/86 | HR 80 | Temp 97.7°F | Ht 59.5 in | Wt 118.2 lb

## 2013-03-28 DIAGNOSIS — M545 Low back pain, unspecified: Secondary | ICD-10-CM | POA: Insufficient documentation

## 2013-03-28 DIAGNOSIS — M79609 Pain in unspecified limb: Secondary | ICD-10-CM | POA: Insufficient documentation

## 2013-03-28 DIAGNOSIS — M5416 Radiculopathy, lumbar region: Secondary | ICD-10-CM

## 2013-03-28 DIAGNOSIS — IMO0002 Reserved for concepts with insufficient information to code with codable children: Secondary | ICD-10-CM

## 2013-03-28 DIAGNOSIS — G8929 Other chronic pain: Secondary | ICD-10-CM | POA: Insufficient documentation

## 2013-03-28 DIAGNOSIS — R51 Headache: Secondary | ICD-10-CM

## 2013-03-28 DIAGNOSIS — Z7982 Long term (current) use of aspirin: Secondary | ICD-10-CM | POA: Insufficient documentation

## 2013-03-28 DIAGNOSIS — Z79899 Other long term (current) drug therapy: Secondary | ICD-10-CM | POA: Insufficient documentation

## 2013-03-28 DIAGNOSIS — M549 Dorsalgia, unspecified: Secondary | ICD-10-CM

## 2013-03-28 MED ORDER — ONDANSETRON 4 MG PO TBDP
4.0000 mg | ORAL_TABLET | Freq: Once | ORAL | Status: AC
  Administered 2013-03-28: 4 mg via ORAL
  Filled 2013-03-28: qty 1

## 2013-03-28 MED ORDER — KETOROLAC TROMETHAMINE 30 MG/ML IJ SOLN
30.0000 mg | Freq: Once | INTRAMUSCULAR | Status: AC
  Administered 2013-03-28: 30 mg via INTRAMUSCULAR

## 2013-03-28 MED ORDER — KETOROLAC TROMETHAMINE 30 MG/ML IM SOLN: 30.0000 mg | Freq: Once | INTRAMUSCULAR | Status: DC

## 2013-03-28 MED ORDER — METHYLPREDNISOLONE SODIUM SUCC 125 MG IJ SOLR
125.0000 mg | Freq: Once | INTRAMUSCULAR | Status: AC
  Administered 2013-03-28: 125 mg via INTRAMUSCULAR
  Filled 2013-03-28: qty 2

## 2013-03-28 MED ORDER — HYDROMORPHONE HCL PF 1 MG/ML IJ SOLN
1.0000 mg | Freq: Once | INTRAMUSCULAR | Status: AC
  Administered 2013-03-28: 1 mg via INTRAMUSCULAR
  Filled 2013-03-28: qty 1

## 2013-03-28 NOTE — Patient Instructions (Addendum)
Patient sent to ED.

## 2013-03-28 NOTE — Progress Notes (Signed)
  Subjective:    Patient ID: Victoria Ellis, female    DOB: 10/05/1976, 37 y.o.   MRN: [id number]  HPI Presents today due to severe LLE pain. She states she fell yesterday due to her left leg being weaker. She states her LLE pain has progressively worsened in the past two weeks. She describes it as a "dull" sensation that radiates from her left lower back down to near her knee. She does admit to frequent falls, no injuries reported. She denies any trauma. She denies saddle anesthesia. She denies bowel or bladder incontinence. She rates her pain 10/10 and is tearful. She also admits to a HA over the past two weeks, mostly occipital, described as "pain" that "comes and goes" and can last about 2 hrs to 5 hrs. No fever, chills.   Review of Systems Severe pain in LLE, achy/dull sensation in LLE.    Objective:   Physical Exam Filed Vitals:   03/28/13 1510  BP: 135/86  Pulse: 80  Temp: 97.7 F (36.5 C)   GEN: AAOx3, moderate distress, tearful. HEENT: No nuchal rigidity. NEURO: 3+ reflexes throughout RLE and LLE, except for an absent achilles reflex in Left foot. Sensation intact. Left Lower ext straight leg test positive.       Assessment & Plan:  36 yr. Old WF w/ hx chronic lumbar radiculopathy, hx left hemilaminotomy L5-S1, with acutely worsening left lower extremity weakness and severe pain. -Toradol 30 mg IM x 1. -She needs immediate MRI and possibly ortho/spine, Neurosurg consult depending on findings, due to increasing left lower extremity weakness and severe pain. -Send to ED.  Jonah Blue
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
## 2013-03-28 NOTE — ED Notes (Signed)
The pt is c/o back and leg pain for 3 weeks.  She has chronic back  Pain sent here from the clinic after receiving a toradol  injection

## 2013-03-28 NOTE — ED Notes (Signed)
No new injury

## 2013-03-28 NOTE — Assessment & Plan Note (Signed)
Assessment Acute worsening of a chronic pain condition without saddle anesthesia or changes in bowel or bladder habits.  Plan Patient will receive toradol 30mg  injection in clinic for pain relief. She will be sent to the ED to obtain an MRI to further evaluate for any changes that may account for her acute decomposition.
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
## 2013-03-28 NOTE — Assessment & Plan Note (Addendum)
Assessment 2 weeks of daily headache with intermittent double vision occuring during headaches.   Plan Given the temporal relationship of these chronic daily headaches to her worsening back and leg pain, we will refrain from further work up at this time. May consider preventative and abortive medications for migraine if the symptoms persist.

## 2013-03-31 ENCOUNTER — Telehealth: Payer: Self-pay | Admitting: *Deleted

## 2013-03-31 NOTE — Telephone Encounter (Signed)
Pt called stating she was seen in ED on Friday, She was sent to ED from clinic.   An MRI was done and patient sent home. She was told she had the same problem from last time. No pain meds given.  Pt scheduled in clinic tomorrow for migraine headache, this was not resolved at last visit

## 2013-04-01 ENCOUNTER — Ambulatory Visit: Payer: Medicaid Other | Admitting: Internal Medicine

## 2013-04-02 NOTE — ED Provider Notes (Signed)
Medical screening examination/treatment/procedure(s) were performed by non-physician practitioner and as supervising physician I was immediately available for consultation/collaboration.  Derwood Kaplan, MD 04/02/13 [phone]

## 2013-04-07 ENCOUNTER — Ambulatory Visit: Payer: Medicaid Other | Admitting: Internal Medicine

## 2013-04-07 ENCOUNTER — Encounter: Payer: Self-pay | Admitting: Internal Medicine

## 2013-04-07 ENCOUNTER — Observation Stay (HOSPITAL_COMMUNITY)
Admission: AD | Admit: 2013-04-07 | Discharge: 2013-04-09 | Disposition: A | Discharge status: Home / Self Care | Payer: Medicaid Other | Source: Ambulatory Visit | Attending: Internal Medicine | Admitting: Internal Medicine

## 2013-04-07 VITALS — BP 126/80 | HR 72 | Temp 97.6°F | Ht 59.0 in | Wt 119.3 lb

## 2013-04-07 DIAGNOSIS — M216X9 Other acquired deformities of unspecified foot: Secondary | ICD-10-CM

## 2013-04-07 DIAGNOSIS — F419 Anxiety disorder, unspecified: Secondary | ICD-10-CM

## 2013-04-07 DIAGNOSIS — F410 Panic disorder [episodic paroxysmal anxiety] without agoraphobia: Secondary | ICD-10-CM

## 2013-04-07 DIAGNOSIS — M21372 Foot drop, left foot: Secondary | ICD-10-CM | POA: Diagnosis present

## 2013-04-07 DIAGNOSIS — R269 Unspecified abnormalities of gait and mobility: Secondary | ICD-10-CM

## 2013-04-07 DIAGNOSIS — G43901 Migraine, unspecified, not intractable, with status migrainosus: Secondary | ICD-10-CM | POA: Insufficient documentation

## 2013-04-07 DIAGNOSIS — M5410 Radiculopathy, site unspecified: Secondary | ICD-10-CM | POA: Diagnosis present

## 2013-04-07 DIAGNOSIS — G8929 Other chronic pain: Secondary | ICD-10-CM | POA: Insufficient documentation

## 2013-04-07 DIAGNOSIS — IMO0002 Reserved for concepts with insufficient information to code with codable children: Secondary | ICD-10-CM

## 2013-04-07 DIAGNOSIS — M545 Low back pain, unspecified: Secondary | ICD-10-CM | POA: Insufficient documentation

## 2013-04-07 LAB — RAPID URINE DRUG SCREEN, HOSP PERFORMED
Barbiturates: NOT DETECTED
Cocaine: NOT DETECTED
Tetrahydrocannabinol: NOT DETECTED

## 2013-04-07 LAB — COMPREHENSIVE METABOLIC PANEL
AST: 14 U/L (ref 0–37)
Albumin: 3.8 g/dL (ref 3.5–5.2)
Calcium: 9 mg/dL (ref 8.4–10.5)
Creatinine, Ser: 0.72 mg/dL (ref 0.50–1.10)
GFR calc non Af Amer: >90 mL/min (ref >90)
Total Protein: 6.7 g/dL (ref 6.0–8.3)

## 2013-04-07 LAB — CBC
MCH: 29.4 pg (ref 26.0–34.0)
MCHC: 33.7 g/dL (ref 30.0–36.0)
MCV: 87.2 fL (ref 78.0–100.0)
Platelets: 326 10*3/uL (ref 150–400)
RDW: 13.1 % (ref 11.5–15.5)

## 2013-04-07 MED ORDER — HYDROMORPHONE HCL PF 1 MG/ML IJ SOLN
1.0000 mg | INTRAMUSCULAR | Status: DC | PRN
  Administered 2013-04-07 – 2013-04-08 (×4): 1 mg via INTRAVENOUS
  Filled 2013-04-07 (×4): qty 1

## 2013-04-07 MED ORDER — ENOXAPARIN SODIUM 40 MG/0.4ML ~~LOC~~ SOLN
40.0000 mg | SUBCUTANEOUS | Status: DC
  Administered 2013-04-07 – 2013-04-08 (×2): 40 mg via SUBCUTANEOUS
  Filled 2013-04-07 (×3): qty 0.4

## 2013-04-07 MED ORDER — SODIUM CHLORIDE 0.9 % IV SOLN: 250.0000 mL | INTRAVENOUS | Status: DC | PRN

## 2013-04-07 MED ORDER — SODIUM CHLORIDE 0.9 % IJ SOLN
3.0000 mL | Freq: Two times a day (BID) | INTRAMUSCULAR | Status: DC
  Administered 2013-04-07 – 2013-04-09 (×4): 3 mL via INTRAVENOUS

## 2013-04-07 MED ORDER — SODIUM CHLORIDE 0.9 % IJ SOLN: 3.0000 mL | INTRAMUSCULAR | Status: DC | PRN

## 2013-04-07 MED ORDER — PREGABALIN 50 MG PO CAPS
50.0000 mg | ORAL_CAPSULE | Freq: Two times a day (BID) | ORAL | Status: DC
  Administered 2013-04-07 – 2013-04-09 (×4): 50 mg via ORAL
  Filled 2013-04-07 (×4): qty 1

## 2013-04-07 MED ORDER — SERTRALINE HCL 50 MG PO TABS
150.0000 mg | ORAL_TABLET | Freq: Every day | ORAL | Status: DC
  Administered 2013-04-07 – 2013-04-08 (×2): 150 mg via ORAL
  Filled 2013-04-07 (×3): qty 1

## 2013-04-07 MED ORDER — ALBUTEROL SULFATE HFA 108 (90 BASE) MCG/ACT IN AERS
2.0000 | INHALATION_SPRAY | Freq: Three times a day (TID) | RESPIRATORY_TRACT | Status: DC | PRN
  Filled 2013-04-07: qty 6.7

## 2013-04-07 MED ORDER — TRAMADOL HCL 50 MG PO TABS
50.0000 mg | ORAL_TABLET | Freq: Three times a day (TID) | ORAL | Status: DC
  Administered 2013-04-07 – 2013-04-09 (×7): 50 mg via ORAL
  Filled 2013-04-07 (×9): qty 1

## 2013-04-07 MED ORDER — DIPHENHYDRAMINE HCL 50 MG/ML IJ SOLN
25.0000 mg | Freq: Once | INTRAMUSCULAR | Status: AC
  Administered 2013-04-07: 25 mg via INTRAVENOUS
  Filled 2013-04-07: qty 1

## 2013-04-07 NOTE — Assessment & Plan Note (Signed)
Recent Exacerbation following mechanical fall. Oxycontin-IR less effective since.  MRI with small to moderate left lateral recess disc extrusion at L5-S1 affecting the descending left S1 nerve roots. S/p laminectomy per Dr. August Saucer of [facility].  Will need follow-up although pt states Dr. August Saucer would not operate again.

## 2013-04-07 NOTE — Assessment & Plan Note (Signed)
Migraine for >72 hours unrelieved by Exedrin, Oxycontin-IR and tramadol. Pt in severe pain on exam reports daily migraines for several weeks now.  Now intractable associated with double vision, unilateral and occipital head pain.  Contraindication to triptan given concurrent sertraline tx which could result in Serotonin Syndrome. Plan -admit for observation and IV abortive migraine therapy
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
## 2013-04-07 NOTE — Progress Notes (Signed)
  Admission note:  Arrival Method: Direct admit Mental Orientation: Alert and oriented x 4 Telemetry: N/A Assessment: NAD Skin: Intact IV: No IV Pain: 8/10 headache Tubes: N/A Safety Measures: Patient Handbook has been given. Safety video viewed by pt.  Admission: Completed and admission orders have been written  6700 Orientation: Patient has been oriented to the unit, staff and to the room.  Family: None at bedside

## 2013-04-07 NOTE — Progress Notes (Addendum)
Subjective:    Patient ID: Victoria Ellis, female    DOB: Sep 28, 1976, 37 y.o.   MRN: [id number]  HPI  Victoria Ellis is 37 yo female with hx significant for lumbar back pain with radiculopathy and left foot drop, panic disorder and anxiety managed with OxyContin-IR 5 mg every 6 hour, tramadol 50 mg q 8 hour and sertraline 50 mg daily who presents for post ED followup. She was evaluated 10 days ago on 03/28/13 for acute on chronic back pain secondary to mechanical fall. She was sent to the emergency room from clinic for MRI of the lower spine which demonstrated left lateral disc extrusion with evidence of annular tear. She had previous laminectomy per Dr. August Saucer of [facility] several years ago and states that Dr. August Saucer would not want to repeat her back surgery. She presents today stating that she's had migraines over the past several weeks and this most recent migraine has been going on for more than a week. She states that she's been taking Excedrin with minimal relief in addition to her oxycodone and tramadol. She states that the Dilaudid received in ED only help her migraine minimally and the Toradol only made her sleepy. Migraine associated with nausea, vomiting, double vision, and photophobia.   Review of Systems  Constitutional: Negative for fever and fatigue.  HENT: Positive for neck pain. Negative for neck stiffness.   Eyes: Positive for visual disturbance.       Double vision with migraines Light sensitivity   Respiratory: Negative for shortness of breath.   Cardiovascular: Negative for chest pain, palpitations and leg swelling.  Gastrointestinal: Positive for nausea and vomiting. Negative for diarrhea and constipation.       Vomiting ~x 2 over past week  Genitourinary: Negative for dysuria.  Musculoskeletal: Positive for back pain and gait problem.       Left foot-drop  Skin: Negative for rash.  Neurological: Positive for weakness, numbness and headaches. Negative for seizures,  syncope and light-headedness.       Unilateral headache but side changes Left foot numbness and weakness       Objective:   Physical Exam  Constitutional: She is oriented to person, place, and time. She appears well-developed and well-nourished. She appears distressed.  Anxious, slightly tearful, shaking her legs  HENT:  Head: Normocephalic and atraumatic.  Eyes: Conjunctivae and EOM are normal. Pupils are equal, round, and reactive to light.  Neck: Normal range of motion. Neck supple. No thyromegaly present.  Cardiovascular: Normal rate, regular rhythm, normal heart sounds and intact distal pulses.   No murmur heard. Pulmonary/Chest: Effort normal and breath sounds normal. No respiratory distress.  Abdominal: Soft. Bowel sounds are normal. She exhibits no distension. There is no tenderness.  Musculoskeletal: Normal range of motion. She exhibits no edema and no tenderness.  Lymphadenopathy:    She has no cervical adenopathy.  Neurological: She is alert and oriented to person, place, and time. She displays abnormal reflex. No cranial nerve deficit.  Reflex Scores:      Patellar reflexes are 2+ on the right side and 1+ on the left side.      Achilles reflexes are 2+ on the right side and 1+ on the left side. Skin: Skin is warm and dry.  Psychiatric: Her speech is normal. Judgment and thought content normal. Her mood appears anxious. She is agitated. She is not hyperactive. Cognition and memory are normal.          Assessment & Plan:  1.  Migraine with status migrainosus: poorly responsive to excedrin, tramadol and oxycontin.  -Deferred Triptan  abortive therapy given concurrent use of sertraline -Admit for observation and IV therapy to likely include anticonvulsive such as valproic acid and/or Compazine, dihydroergotamine, etc  2. acute on chronic Lower back pain with radiculopathy and foot drop: Continue oxycodone unless changing opioid therapy during hospital  observation -Consider consultation with [place] [facility] for further recommendations given current back pain and MRI findings as noted above  3. panic anxiety syndrome: Had been controlled well on sertraline 50 mg daily

## 2013-04-07 NOTE — Assessment & Plan Note (Signed)
Controlled with Sertraline, although acutely pt id anxious given current pain and need for hospital observation.

## 2013-04-08 ENCOUNTER — Observation Stay (HOSPITAL_COMMUNITY): Payer: Medicaid Other

## 2013-04-08 DIAGNOSIS — F411 Generalized anxiety disorder: Secondary | ICD-10-CM

## 2013-04-08 DIAGNOSIS — R269 Unspecified abnormalities of gait and mobility: Secondary | ICD-10-CM

## 2013-04-08 LAB — BASIC METABOLIC PANEL
BUN: 12 mg/dL (ref 6–23)
CO2: 28 mEq/L (ref 19–32)
Chloride: 106 mEq/L (ref 96–112)
GFR calc Af Amer: >90 mL/min (ref >90)
Glucose, Bld: 82 mg/dL (ref 70–99)
Potassium: 4.3 mEq/L (ref 3.5–5.1)

## 2013-04-08 LAB — CBC
HCT: 33.2 % — ABNORMAL LOW (ref 36.0–46.0)
Hemoglobin: 11 g/dL — ABNORMAL LOW (ref 12.0–15.0)
MCHC: 33.1 g/dL (ref 30.0–36.0)

## 2013-04-08 MED ORDER — NAPROXEN 500 MG PO TABS
500.0000 mg | ORAL_TABLET | Freq: Two times a day (BID) | ORAL | Status: DC
  Administered 2013-04-09: 500 mg via ORAL
  Filled 2013-04-08 (×2): qty 1

## 2013-04-08 MED ORDER — HYDROMORPHONE HCL PF 1 MG/ML IJ SOLN
2.0000 mg | INTRAMUSCULAR | Status: DC | PRN
  Administered 2013-04-08 – 2013-04-09 (×6): 2 mg via INTRAVENOUS
  Filled 2013-04-08 (×6): qty 2

## 2013-04-08 MED ORDER — DIPHENHYDRAMINE HCL 50 MG/ML IJ SOLN
12.5000 mg | Freq: Once | INTRAMUSCULAR | Status: AC
  Administered 2013-04-08: 12.5 mg via INTRAVENOUS
  Filled 2013-04-08: qty 1

## 2013-04-08 MED ORDER — NAPROXEN 125 MG/5ML PO SUSP: 500.0000 mg | Freq: Two times a day (BID) | ORAL | Status: DC

## 2013-04-08 MED ORDER — KETOROLAC TROMETHAMINE 15 MG/ML IJ SOLN
30.0000 mg | Freq: Once | INTRAMUSCULAR | Status: AC
  Administered 2013-04-08: 30 mg via INTRAVENOUS
  Filled 2013-04-08: qty 2

## 2013-04-08 MED ORDER — HYDROMORPHONE HCL PF 1 MG/ML IJ SOLN
0.5000 mg | INTRAMUSCULAR | Status: DC | PRN
  Administered 2013-04-08: 0.5 mg via INTRAVENOUS
  Filled 2013-04-08: qty 1

## 2013-04-08 MED ORDER — METHOCARBAMOL 100 MG/ML IJ SOLN: 500.0000 mg | Freq: Four times a day (QID) | INTRAMUSCULAR | Status: DC | PRN

## 2013-04-08 MED ORDER — DEXAMETHASONE SODIUM PHOSPHATE 10 MG/ML IJ SOLN
10.0000 mg | Freq: Once | INTRAMUSCULAR | Status: AC
  Administered 2013-04-08: 10 mg via INTRAVENOUS
  Filled 2013-04-08: qty 1

## 2013-04-08 MED ORDER — METOCLOPRAMIDE HCL 5 MG/ML IJ SOLN
10.0000 mg | Freq: Once | INTRAMUSCULAR | Status: AC
  Administered 2013-04-08: 10 mg via INTRAVENOUS
  Filled 2013-04-08: qty 2
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
## 2013-04-08 NOTE — Progress Notes (Signed)
UR COMPLETED  
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
## 2013-04-09 ENCOUNTER — Encounter (HOSPITAL_COMMUNITY): Payer: Self-pay | Admitting: *Deleted

## 2013-04-09 MED ORDER — PANTOPRAZOLE SODIUM 20 MG PO TBEC
20.0000 mg | DELAYED_RELEASE_TABLET | Freq: Every day | ORAL | Status: DC
  Administered 2013-04-09: 20 mg via ORAL
  Filled 2013-04-09: qty 1

## 2013-04-09 MED ORDER — PREDNISONE 50 MG PO TABS
60.0000 mg | ORAL_TABLET | Freq: Every day | ORAL | Status: DC
  Administered 2013-04-09: 60 mg via ORAL
  Filled 2013-04-09 (×2): qty 1

## 2013-04-09 MED ORDER — PREGABALIN 50 MG PO CAPS: 50.0000 mg | ORAL_CAPSULE | Freq: Two times a day (BID) | ORAL | Status: DC

## 2013-04-09 MED ORDER — NAPROXEN 500 MG PO TABS: 500.0000 mg | ORAL_TABLET | Freq: Two times a day (BID) | ORAL | Status: DC

## 2013-04-09 MED ORDER — PREDNISONE (PAK) 10 MG PO TABS: ORAL_TABLET | ORAL | Status: DC

## 2013-04-09 MED ORDER — PANTOPRAZOLE SODIUM 20 MG PO TBEC: 20.0000 mg | DELAYED_RELEASE_TABLET | Freq: Every day | ORAL | Status: DC
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
## 2013-04-09 NOTE — Progress Notes (Signed)
Subjective: Patient reports that her headache is much improved. Rates her headache at a 3/10.  She has been unable to control her headaches on an outpatient basis despite Tramadol and Oxycodone.    Objective: Current vital signs: BP 113/59  Pulse 80  Temp(Src) 98 F (36.7 C) (Oral)  Resp 16  Ht 4\' 11"  (1.499 m)  Wt 55.339 kg (122 lb)  BMI 24.63 kg/m2  SpO2 100%  LMP 03/23/2013 Vital signs in last 24 hours: Temp:  [97.7 F (36.5 C)-98.4 F (36.9 C)] 98 F (36.7 C) (06/18 0941) Pulse Rate:  [67-88] 80 (06/18 0941) Resp:  [14-18] 16 (06/18 0941) BP: (112-139)/(56-77) 113/59 mmHg (06/18 0941) SpO2:  [98 %-100 %] 100 % (06/18 0941) Weight:  [55.339 kg (122 lb)] 55.339 kg (122 lb) (06/17 2031)  Intake/Output from previous day: 06/17 0701 - 06/18 0700 In: 600 [P.O.:600] Out: 850 [Urine:850] Intake/Output this shift: Total I/O In: 240 [P.O.:240] Out: -  Nutritional status: General  Neurologic Exam: Mental Status:  Alert, oriented, thought content appropriate. Speech fluent without evidence of aphasia. Able to follow 3 step commands without difficulty.  Cranial Nerves:  II: visual fields grossly normal, pupils equal, round, reactive to light and accommodation. Photophobia.  III,IV, VI: ptosis not present, extra-ocular motions intact bilaterally  V,VII: smile symmetric, facial light touch sensation normal bilaterally  VIII: hearing normal bilaterally, phonophobia.  IX,X: gag reflex present  XI: trapezius strength/neck flexion strength normal bilaterally  XII: tongue strength normal  Motor:  5/5 in the BUE's Tone and bulk:normal tone throughout; no atrophy noted  Sensory: Pinprick and light touch intact throughout, bilaterally  Deep Tendon Reflexes: 2+ and symmetric to right knee, diminished right knee/ankle  Plantars:  Right: equivocol    Left: downgoing  Cerebellar:  normal finger-to-nose   Lab Results: Basic Metabolic Panel:  Recent Labs Lab 04/07/13 1717  04/08/13 0535  NA 137 139  K 3.9 4.3  CL 102 106  CO2 25 28  GLUCOSE 94 82  BUN 12 12  CREATININE 0.72 0.74  CALCIUM 9.0 8.9    Liver Function Tests:  Recent Labs Lab 04/07/13 1717  AST 14  ALT 13  ALKPHOS 34*  BILITOT 0.1*  PROT 6.7  ALBUMIN 3.8   No results found for this basename: LIPASE, AMYLASE,  in the last 168 hours No results found for this basename: AMMONIA,  in the last 168 hours  CBC:  Recent Labs Lab 04/07/13 1717 04/08/13 0535  WBC 7.3 5.5  HGB 12.2 11.0*  HCT 36.2 33.2*  MCV 87.2 88.1  PLT 326 276    Cardiac Enzymes: No results found for this basename: CKTOTAL, CKMB, CKMBINDEX, TROPONINI,  in the last 168 hours  Lipid Panel: No results found for this basename: CHOL, TRIG, HDL, CHOLHDL, VLDL, LDLCALC,  in the last 168 hours  CBG: No results found for this basename: GLUCAP,  in the last 168 hours  Microbiology: No results found for this or any previous visit.  Coagulation Studies: No results found for this basename: LABPROT, INR,  in the last 72 hours  Imaging: Ct Head Wo Contrast  04/08/2013   *RADIOLOGY REPORT*  Clinical Data:  Severe left sided headache with photophobia. History of migraine.  CT HEAD WITHOUT CONTRAST  Technique:  Contiguous axial images were obtained from the base of the skull through the vertex without contrast  Comparison:  09/09/2011.  Findings:  The brain has a normal appearance without evidence for hemorrhage, acute infarction, hydrocephalus, or mass lesion.  There is no extra axial fluid collection.  The skull and paranasal sinuses are normal. No change from prior normal scan.  IMPRESSION: Normal CT of the head without contrast.   Original Report Authenticated By: Davonna Belling, M.D.    Medications:  I have reviewed the patient's current medications. Scheduled: . enoxaparin (LOVENOX) injection  40 mg Subcutaneous Q24H  . naproxen  500 mg Oral BID WC  . pregabalin  50 mg Oral BID  . sertraline  150 mg Oral QHS  .  sodium chloride  3 mL Intravenous Q12H  . traMADol  50 mg Oral Q8H    Assessment/Plan: 37 year old presenting with intractable headaches.  Headaches much improved at this point but it does seem that the patient has been experiencing a lot of rebound.  Recommendations: 1.  Prednisone 60mg  daily for 5 days to decrease by 10mg  every other day thereafter until off.  This steroid taper should help with rebound.  Patient to take with food.     LOS: 2 days   Thana Farr, MD [facility] [phone] 04/09/2013  10:35 AM

## 2013-04-09 NOTE — Progress Notes (Signed)
Discussed discharge instructions and medications with pt. Pt showed no barriers to discharge. IV removed. Pt waiting for ride home from friend. Assessment unchanged from morning.

## 2013-04-09 NOTE — Progress Notes (Signed)
Internal Medicine Teaching Service Attending Note Date: 04/09/2013  Patient name: Victoria Ellis  Medical record number: [id number]  Date of birth: 10-Aug-1976   Patient received IV robaxin, and prednisone yesterday and feels a lot better (headache 3/10).   Filed Vitals:   04/09/13 1126  BP: 120/60  Pulse: 60  Temp: 98 F (36.7 C)  Resp: 16   Exam unchanged from yesterday.    Would plan to discharge the patient early morning after observing her overnight for any rebound headaches. She will go home on her ome oxycontin, tramadol and a muscle relaxant, along with a prednisone taper. She will need a neurology follow up as outpatient.    LOS: 2 days  Lowen Barringer 04/09/2013, 1:47 PM.

## 2013-04-10 ENCOUNTER — Other Ambulatory Visit: Payer: Self-pay | Admitting: Internal Medicine
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
## 2013-04-12 NOTE — Discharge Summary (Signed)
Internal Medicine Teaching Service Attending Note Date: 04/12/2013  Patient name: Victoria Ellis  Medical record number: [id number]  Date of birth: April 07, 1976    I evaluated the patient on the day of discharge and discussed the discharge plan with my resident team. I agree with the discharge documentation and disposition.  Thank you for working with me on this patient's care.   Thanks Aletta Edouard 04/12/2013, 4:23 PM

## 2013-04-14 ENCOUNTER — Other Ambulatory Visit: Payer: Self-pay | Admitting: Internal Medicine

## 2013-04-15 ENCOUNTER — Encounter: Payer: Self-pay | Admitting: Internal Medicine

## 2013-04-15 ENCOUNTER — Ambulatory Visit (INDEPENDENT_AMBULATORY_CARE_PROVIDER_SITE_OTHER): Payer: Medicaid Other | Admitting: Internal Medicine

## 2013-04-15 VITALS — BP 131/80 | HR 90 | Temp 97.5°F | Ht 59.0 in | Wt 126.7 lb

## 2013-04-15 DIAGNOSIS — IMO0002 Reserved for concepts with insufficient information to code with codable children: Secondary | ICD-10-CM

## 2013-04-15 DIAGNOSIS — G5792 Unspecified mononeuropathy of left lower limb: Secondary | ICD-10-CM

## 2013-04-15 DIAGNOSIS — G5790 Unspecified mononeuropathy of unspecified lower limb: Secondary | ICD-10-CM

## 2013-04-15 DIAGNOSIS — G43901 Migraine, unspecified, not intractable, with status migrainosus: Secondary | ICD-10-CM

## 2013-04-15 MED ORDER — OXYCODONE HCL 10 MG PO TABS: 10.0000 mg | ORAL_TABLET | Freq: Four times a day (QID) | ORAL | Status: DC | PRN

## 2013-04-15 NOTE — Progress Notes (Signed)
HPI The patient is a 37 y.o. female with a history of seizures, anxiety, migraine, presenting for a hospital follow-up.  The patient was hospitalized 6/16-6/18 for status migrainosus.  CT head negative.  Symptoms improved after multiple medications, initially benadryl, tramadol, decadron, reglan, toradol; later naproxen, robaxin, prednisone dilaudid.  She was discharged on a prednisone taper.  Since discharge, she notes daily headaches but no migraines.  The patient has been taking prednisone on a taper, currently on 50 mg daily.  The patient is most concerned about her back pain.  She has been on Oxycodone 5 mg for years.  Since hospital discharge, she has been taking 10 mg 4 times per day, as reportedly instructed by her physician.  Even so, she notes that she still has significant pain, and expresses frustration at not "getting her pain addressed".  She notes difficulty with sleep due to pain.  The patient also takes Tramadol q8hrs (for at least 3 years), Lyrica twice per day (just since last hospital discharge).  The patient had surgery to her lower back in 2003, and is now considered not to be a further surgical candidate.  The patient has had trigger point injections 3 years ago, but notes no relief.  ROS: General: no fevers, chills, changes in weight, changes in appetite Skin: no rash HEENT: no blurry vision, hearing changes, sore throat Pulm: no dyspnea, coughing, wheezing CV: no chest pain, palpitations, shortness of breath Abd: no abdominal pain, nausea/vomiting, diarrhea/constipation GU: no dysuria, hematuria, polyuria Ext: no arthralgias, myalgias Neuro: no weakness, numbness, or tingling  Filed Vitals:   04/15/13 1512  BP: 131/80  Pulse: 90  Temp: 97.5 F (36.4 C)    PEX General: alert, cooperative, and in no apparent distress HEENT: NCAT, PERRL, EOMI, oropharynx non-erythematous Neck: supple, no lymphadenopathy Lungs: clear to ascultation bilaterally, normal work of  respiration, no wheezes, rales, ronchi Heart: regular rate and rhythm, no murmurs, gallops, or rubs Abdomen: soft, non-tender, non-distended, normal bowel sounds Extremities: no cyanosis, clubbing, or edema Neurologic: alert & oriented X3, cranial nerves II-XII intact, strength 4/5 throughout left hip, knee, and ankle, otherwise strength 5/5 throughout, sensation grossly intact to light touch  Current Outpatient Prescriptions on File Prior to Visit  Medication Sig Dispense Refill  . albuterol (PROVENTIL HFA;VENTOLIN HFA) 108 (90 BASE) MCG/ACT inhaler Inhale 2 puffs into the lungs 3 (three) times daily as needed for wheezing or shortness of breath.      . diphenhydrAMINE (BENADRYL) 25 MG tablet Take 25 mg by mouth at bedtime as needed for sleep.      . naproxen (NAPROSYN) 500 MG tablet Take 1 tablet (500 mg total) by mouth 2 (two) times daily with a meal.  60 tablet  1  . oxycodone (OXY-IR) 5 MG capsule Take 1 capsule (5 mg total) by mouth every 6 (six) hours as needed for pain.  120 capsule  0  . pantoprazole (PROTONIX) 20 MG tablet Take 1 tablet (20 mg total) by mouth daily.  30 tablet  3  . predniSONE (STERAPRED UNI-PAK) 10 MG tablet Take 60mg  (6 tabs) for 4 days, then 50mg  (5 tabs) for 3 days, then 40mg  (4 tabs) for 3 days, then 30mg  (3 tabs) for 3 days, then 20mg  (2 tabs) for 3 days, then 10mg  (1 tabs) for 3 days.  60 tablet  0  . pregabalin (LYRICA) 50 MG capsule Take 1 capsule (50 mg total) by mouth 2 (two) times daily.  60 capsule  1  . sertraline (ZOLOFT)  50 MG tablet Take 150 mg by mouth at bedtime.      . sertraline (ZOLOFT) 50 MG tablet TAKE THREE TABLETS BY MOUTH DAILY  90 tablet  0  . traMADol (ULTRAM) 50 MG tablet Take 50 mg by mouth every 8 (eight) hours.       No current facility-administered medications on file prior to visit.    Assessment/Plan

## 2013-04-15 NOTE — Assessment & Plan Note (Signed)
The patient notes chronic lumbar back pain (with MRI 03/2013 showing disc extrusion at L5-L6), with radiculopathy (noted by prior EMG).  Narcotic database review reveals that the patient has been on Oxycodone 5 mg, 120 tabs/month, filling her prescription regularly.  Her dose of narcotics was last increased 05/2012, when the quantity of tablets was increased from 90 to 120 tabs/month.  She has been taking 10 mg qid prn since hospital discharge, as reportedly instructed, with minimal improvement in pain.  She has red flag behavior of a positive UDS for cocaine in 2011.  Given that the patient has an anatomic reason for her pain, and likely has developed tolerance after being on long-term narcotics, I believe it is reasonable to increase her narcotic dosage at this time. -increased oxycodone to 10 mg q6hrs prn for pain, #120/month -pain contract re-signed today.  A copy was given to the patient

## 2013-04-15 NOTE — Assessment & Plan Note (Signed)
The patient was recently hospitalized for status migrainosus.  She notes no further migraine since hospital discharge.  She is currently still tapering off prednisone. -continue current medication regimen

## 2013-04-15 NOTE — Patient Instructions (Signed)
General Instructions: For your pain, we are increasing your dose of Oxycodone to 10 mg.  Take 1 tablet every 6 hours as needed for pain.  Please return for a follow-up visit in 1-2 months   Treatment Goals:  Goals (1 Years of Data) as of 04/15/13     Lifestyle    . Prevent Falls       Progress Toward Treatment Goals:  Treatment Goal 04/15/2013  Prevent falls unchanged    Self Care Goals & Plans:  Self Care Goal 03/28/2013  Manage my medications take my medicines as prescribed; bring my medications to every visit; refill my medications on time  Eat healthy foods drink diet soda or water instead of juice or soda; eat more vegetables; eat foods that are low in salt; eat baked foods instead of fried foods       Care Management & Community Referrals:  Referral 04/15/2013  Referrals made for care management support none needed

## 2013-04-17 NOTE — Progress Notes (Signed)
Case discussed with Dr. Brown (at time of visit, soon after the resident saw the patient).  We reviewed the resident's history and exam and pertinent patient test results.  I agree with the assessment, diagnosis, and plan of care documented in the resident's note. 

## 2013-05-12 ENCOUNTER — Other Ambulatory Visit: Payer: Self-pay | Admitting: *Deleted

## 2013-05-19 ENCOUNTER — Ambulatory Visit (INDEPENDENT_AMBULATORY_CARE_PROVIDER_SITE_OTHER): Payer: Medicaid Other | Admitting: Internal Medicine

## 2013-05-19 DIAGNOSIS — R269 Unspecified abnormalities of gait and mobility: Secondary | ICD-10-CM

## 2013-05-19 DIAGNOSIS — G4700 Insomnia, unspecified: Secondary | ICD-10-CM

## 2013-05-19 DIAGNOSIS — Z87898 Personal history of other specified conditions: Secondary | ICD-10-CM

## 2013-05-19 DIAGNOSIS — F419 Anxiety disorder, unspecified: Secondary | ICD-10-CM

## 2013-05-19 DIAGNOSIS — IMO0002 Reserved for concepts with insufficient information to code with codable children: Secondary | ICD-10-CM

## 2013-05-19 DIAGNOSIS — F411 Generalized anxiety disorder: Secondary | ICD-10-CM

## 2013-05-19 MED ORDER — OXYCODONE HCL 10 MG PO TABS: 10.0000 mg | ORAL_TABLET | Freq: Four times a day (QID) | ORAL | Status: DC | PRN

## 2013-05-19 MED ORDER — ZOLPIDEM TARTRATE 5 MG PO TABS: 5.0000 mg | ORAL_TABLET | Freq: Every evening | ORAL | Status: DC | PRN

## 2013-05-19 NOTE — Patient Instructions (Addendum)
I am glad to hear that your migraines have improved. I have refilled the oxycodone at 10 mg every 6 hours as needed for your back pain. I will prescribe Ambien at a reduced dose (5 mg) at bedtime for your insomnia. If you have odd behaviors please stop taking. Follow-up with [facility]. They will be in contact with you for Pap Smear and other health maintenance care. Follow-up with me in 6 months for a recheck or earlier if needed. Zolpidem tablets What is this medicine? ZOLPIDEM (zole PI dem) is used to treat insomnia. This medicine helps you to fall asleep and sleep through the night. This medicine may be used for other purposes; ask your health care provider or pharmacist if you have questions. What should I tell my health care provider before I take this medicine? They need to know if you have any of these conditions: -depression -history of a drug or alcohol abuse problem -liver disease -lung or breathing disease -suicidal thoughts -an unusual or allergic reaction to zolpidem, other medicines, foods, dyes, or preservatives -pregnant or trying to get pregnant -breast-feeding How should I use this medicine? Take this medicine by mouth with a glass of water. Follow the directions on the prescription label. It is better to take this medicine on an empty stomach and only when you are ready for bed. Do not take your medicine more often than directed. If you have been taking this medicine for several weeks and suddenly stop taking it, you may get unpleasant withdrawal symptoms. Your doctor or health care professional may want to gradually reduce the dose. Do not stop taking this medicine on your own. Always follow your doctor or health care professional's advice. A special MedGuide will be given to you by the pharmacist with each prescription and refill. Be sure to read this information carefully each time. Talk to your pediatrician regarding the use of this medicine in children. Special  care may be needed. Overdosage: If you think you have taken too much of this medicine contact a poison control center or emergency room at once. NOTE: This medicine is only for you. Do not share this medicine with others. What if I miss a dose? This does not apply. This medicine should only be taken immediately before going to sleep. Do not take double or extra doses. What may interact with this medicine? -herbal medicines like kava kava, melatonin, St. John's wort and valerian -medicines for fungal infections like ketoconazole, fluconazole, or itraconazole -medicines for treating depression or other mental problems -other medicines given for sleep -some medicines for Parkinson' s disease or other movement disorders -some medicines used to treat HIV infection or AIDS, like ritonavir This list may not describe all possible interactions. Give your health care provider a list of all the medicines, herbs, non-prescription drugs, or dietary supplements you use. Also tell them if you smoke, drink alcohol, or use illegal drugs. Some items may interact with your medicine. What should I watch for while using this medicine? Visit your doctor or health care professional for regular checks on your progress. Keep a regular sleep schedule by going to bed at about the same time each night. Avoid caffeine-containing drinks in the evening hours. When sleep medicines are used every night for more than a few weeks, they may stop working. Talk to your doctor if you still have trouble sleeping. Do not take this medicine unless you are able to get a full night's sleep before you must be active again. You may  not be able to remember things that you do in the hours after you take this medicine. Some people have reported driving, making phone calls, or preparing and eating food while asleep after taking sleep medicine. Take this medicine right before going to sleep. Tell your doctor if you are have any problems with your  memory. After you stop taking this medicine, you may have trouble falling asleep. This is called rebound insomnia. This problem usually goes away on its own after 1 or 2 nights. You may get drowsy or dizzy. Do not drive, use machinery, or do anything that needs mental alertness until you know how this medicine affects you. Do not stand or sit up quickly, especially if you are an older patient. This reduces the risk of dizzy or fainting spells. Alcohol may interfere with the effect of this medicine. Avoid alcoholic drinks. This medicine may cause a decrease in mental alertness the morning after use, even if you feel that you are fully awake. Tell your doctor if you will need to perform activities requiring full alertness, such as driving, the next morning after you have taken this medicine. If you or your family notice any changes in your behavior, or if you have any unusual or disturbing thoughts, call your doctor right away. What side effects may I notice from receiving this medicine? Side effects that you should report to your doctor or health care professional as soon as possible: -allergic reactions like skin rash, itching or hives, swelling of the face, lips, or tongue -changes in vision -confusion -depressed mood -feeling faint or lightheaded, falls -hallucinations -problems with balance, speaking, walking -restlessness, excitability, or feelings of agitation -unusual activities while asleep like driving, eating, making phone calls Side effects that usually do not require medical attention (report to your doctor or health care professional if they continue or are bothersome): -diarrhea -dizziness, or daytime drowsiness, sometimes called a hangover effect -headache This list may not describe all possible side effects. Call your doctor for medical advice about side effects. You may report side effects to FDA at 1-800-FDA-1088. Where should I keep my medicine? Keep out of the reach of  children. This medicine can be abused. Keep your medicine in a safe place to protect it from theft. Do not share this medicine with anyone. Selling or giving away this medicine is dangerous and against the law. Store at room temperature between 20 and 25 degrees C (68 and 77 degrees F). Throw away any unused medicine after the expiration date. NOTE: This sheet is a summary. It may not cover all possible information. If you have questions about this medicine, talk to your doctor, pharmacist, or health care provider.  2013, [employer]. (11/02/2011 5:34:04 PM)

## 2013-05-19 NOTE — Progress Notes (Signed)
  Subjective:    Patient ID: Victoria Ellis, female    DOB: Feb 08, 1976, 37 y.o.   MRN: [id number]  HPI Pt is 37 yo female with hx significant for chronic back with radiculopathy s/p laminectomy managed with chronic opioids as repeat surgery not an prudent option per Orthopedics.  She was recently titrated upwards on her oxycodone from 5 mg q6h to 10 mg q6h and presents for an update on her progress. She reports that pain has not fully resolved but definitely has improved with oxycodone 10 mg q6h and use of a cane. She is still having difficulty falling asleep and states that Unasom and benadryl are ineffective.  Has used Ambien 10 mg in the past but states she had odd behaviors. Health maintenance concern were reviewed including need for regular Pap Smears of which she is overdue.  She states that she has had abnormal Pap Smears in the past and her mother has had cervical cancer thus she would prefer to be seen at [facility] for further assessment including pelvic exam.   Review of Systems  Constitutional: Negative for fever and fatigue.  Eyes: Negative for visual disturbance.  Respiratory: Negative for chest tightness and shortness of breath.   Cardiovascular: Negative for chest pain, palpitations and leg swelling.  Musculoskeletal: Positive for back pain and gait problem.  Skin: Negative for rash.  Neurological: Positive for weakness and headaches. Negative for seizures, speech difficulty and light-headedness.       LLE weakness (drop foot)  Psychiatric/Behavioral: Positive for sleep disturbance. Negative for dysphoric mood. The patient is not nervous/anxious.        Objective:   Physical Exam  Constitutional: She is oriented to person, place, and time. She appears well-developed and well-nourished. No distress.  HENT:  Head: Normocephalic and atraumatic.  Eyes: Conjunctivae and EOM are normal. Pupils are equal, round, and reactive to light.  Neck: Normal range of motion.  Neck supple.  Cardiovascular: Normal rate, regular rhythm and normal heart sounds.   Pulmonary/Chest: Effort normal and breath sounds normal.  Abdominal: Soft. Bowel sounds are normal. She exhibits no distension. There is no tenderness.  Musculoskeletal: She exhibits no edema and no tenderness.  Using cane today, + left foot drop  Neurological: She is alert and oriented to person, place, and time.  Skin: Skin is warm and dry.  Psychiatric: She has a normal mood and affect. Her behavior is normal. Judgment and thought content normal.          Assessment & Plan:  1. Back pain with radiculopathy: stable - refill oxycodone 10 mg  2. Insomnia: try lower dose of Ambien at 5 mg qhs -pt cautioned concerning advirse side effects and literature concerning Ambien given.  3. Health maintenance: pt to have well woman including pap smear at [facility] clinic -referral to Gyn  4. Migraines: stable with only 1 migraine in several weeks

## 2013-05-20 NOTE — Assessment & Plan Note (Signed)
Managed with chronic oxycodone 10 mg q6h. -FYI and pain contract updated at last visit

## 2013-05-20 NOTE — Progress Notes (Signed)
Case discussed with Dr. Schooler soon after the resident saw the patient.  We reviewed the resident's history and exam and pertinent patient test results.  I agree with the assessment, diagnosis, and plan of care documented in the resident's note. 

## 2013-05-20 NOTE — Assessment & Plan Note (Signed)
Stable on Zoloft.

## 2013-05-20 NOTE — Assessment & Plan Note (Addendum)
With left foot drop, Using cane which pt states has decreased falls significantly

## 2013-06-02 ENCOUNTER — Encounter: Payer: Self-pay | Admitting: Obstetrics and Gynecology

## 2013-06-09 ENCOUNTER — Other Ambulatory Visit: Payer: Self-pay | Admitting: Internal Medicine

## 2013-06-09 ENCOUNTER — Other Ambulatory Visit: Payer: Self-pay | Admitting: *Deleted

## 2013-06-09 NOTE — Telephone Encounter (Signed)
Please call in for Tramadol refill which I approved on desktop but cannot be escribed.

## 2013-06-10 NOTE — Telephone Encounter (Signed)
Rx called in 

## 2013-06-16 NOTE — Telephone Encounter (Signed)
Rx filled

## 2013-07-07 ENCOUNTER — Other Ambulatory Visit: Payer: Self-pay | Admitting: Internal Medicine

## 2013-07-07 DIAGNOSIS — IMO0002 Reserved for concepts with insufficient information to code with codable children: Secondary | ICD-10-CM

## 2013-07-07 MED ORDER — TRAMADOL HCL 50 MG PO TABS: ORAL_TABLET | ORAL | Status: DC

## 2013-07-07 NOTE — Telephone Encounter (Signed)
Rx called in to pharmacy. 

## 2013-07-13 IMAGING — CT CT HEAD W/O CM
1 of 2 series · 16 of 30 positions shown, 20 images · non-contrast
Comparison: None.

CLINICAL DATA: Seizures

CT HEAD WITHOUT CONTRAST
TECHNIQUE: Contiguous axial images were obtained from the base of
the skull through the vertex without contrast.

[Series 3: recon 2: brain · axial · 0.47mm/px · z∈[+105,+241]mm · 16 of 80 slices shown, 20 images]
[im 5/80  brain]
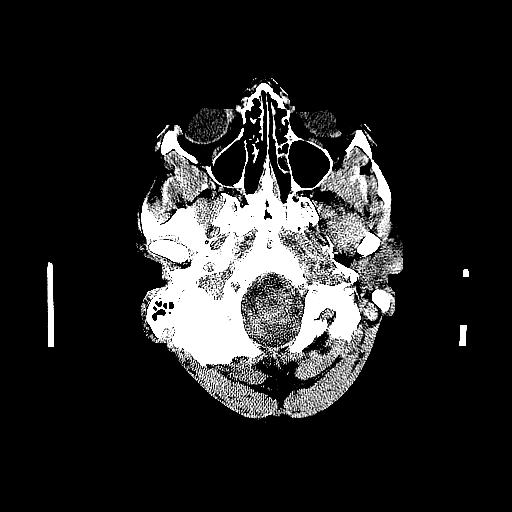
[im 5/80  bone]
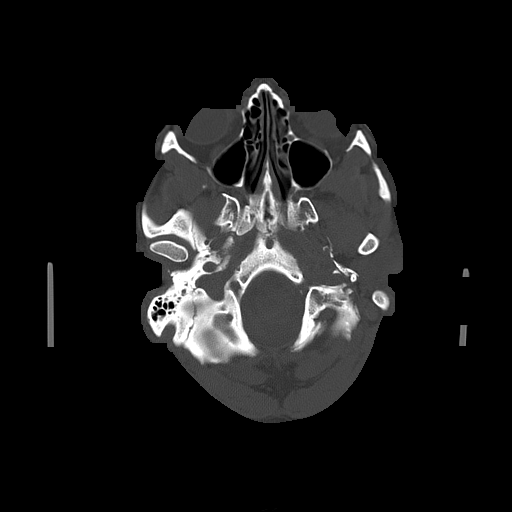
[im 9/80  brain]
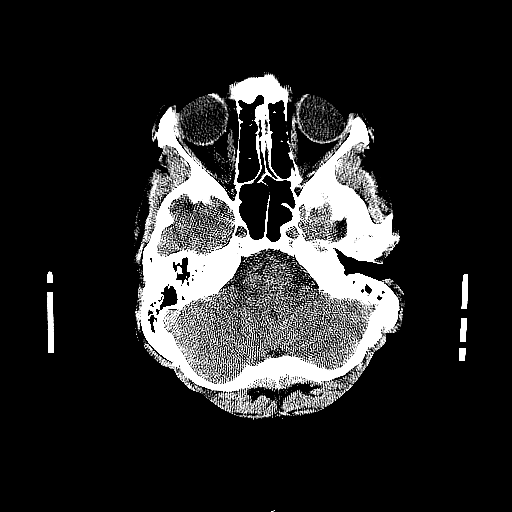
[im 13/80  brain]
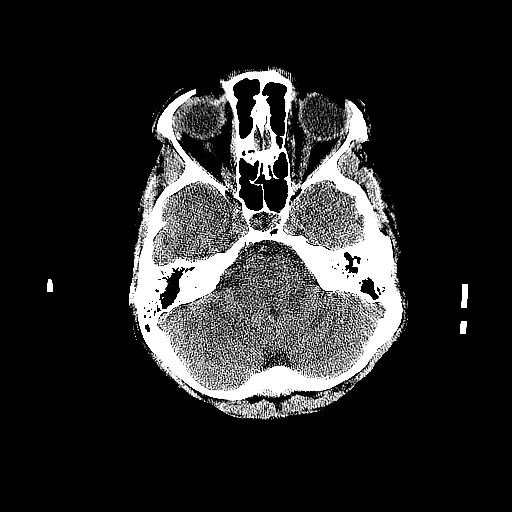
[im 17/80  brain]
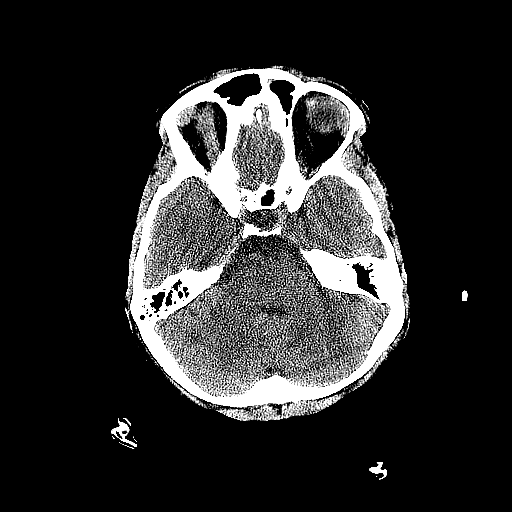
[im 25/80  brain]
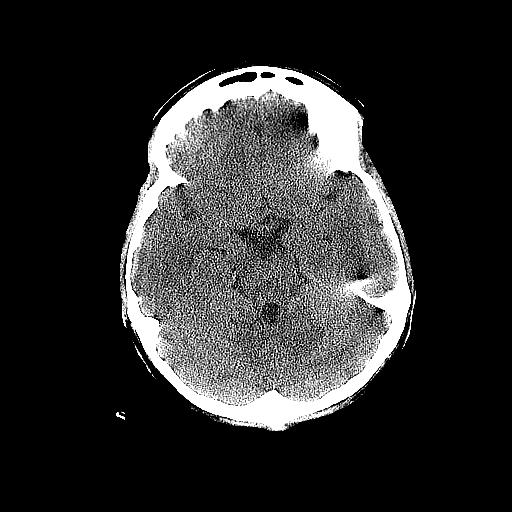
[im 25/80  bone]
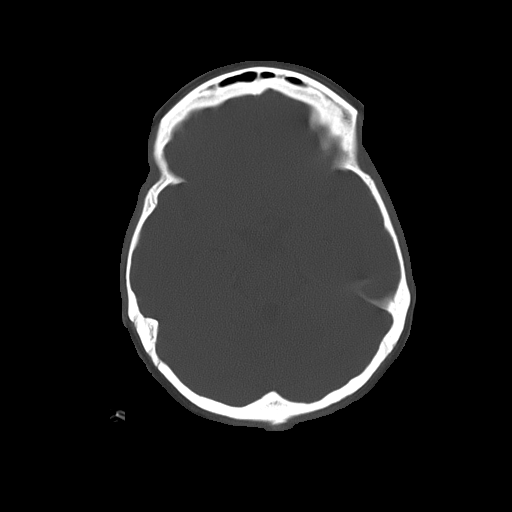
[im 30/80  brain]
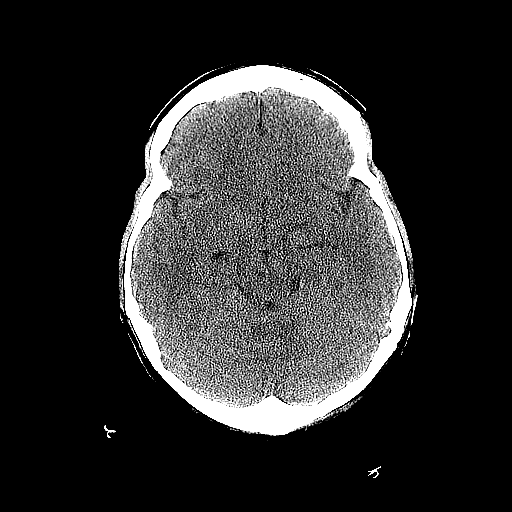
[im 34/80  brain]
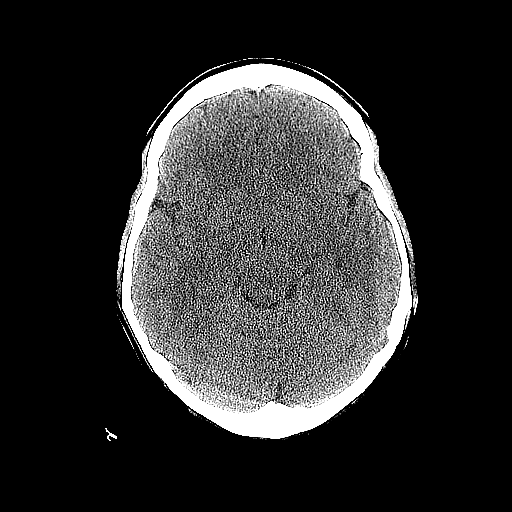
[im 38/80  brain]
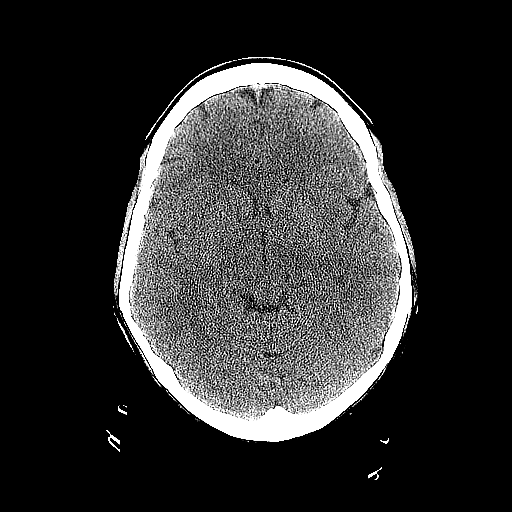
[im 42/80  brain]
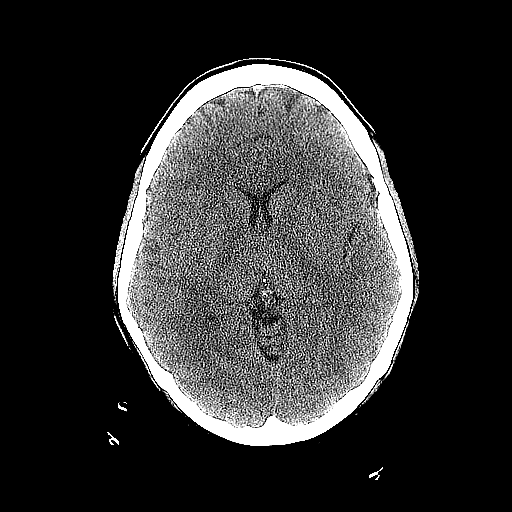
[im 42/80  bone]
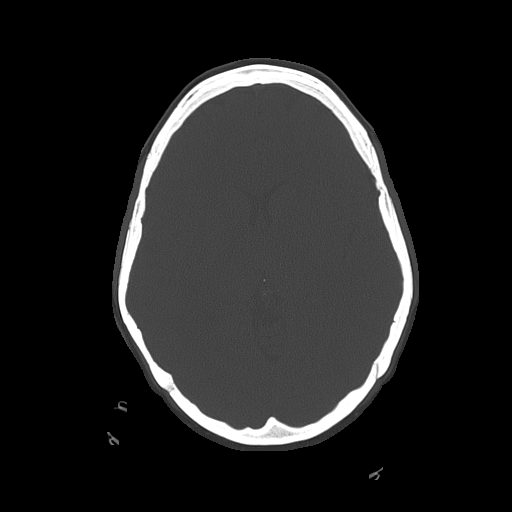
[im 46/80  brain]
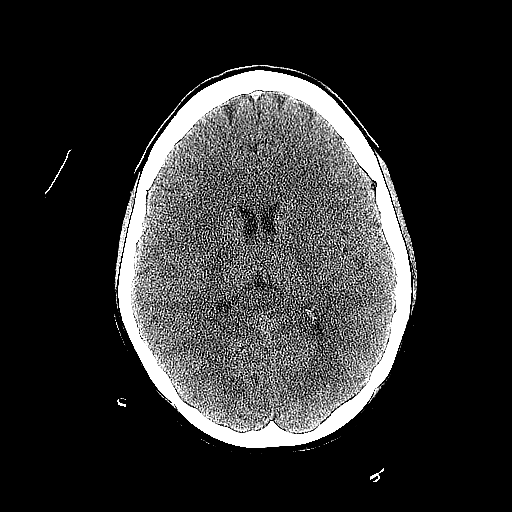
[im 50/80  brain]
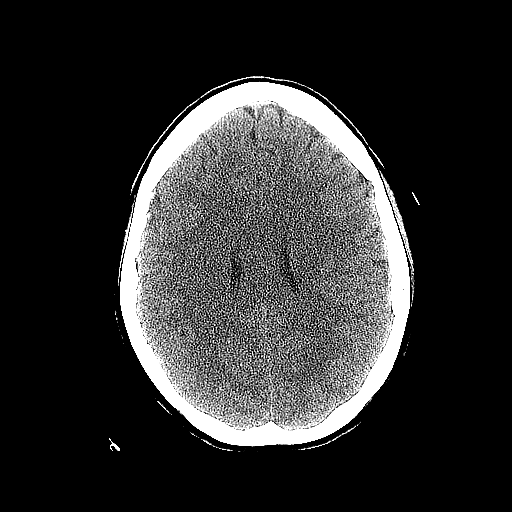
[im 55/80  brain]
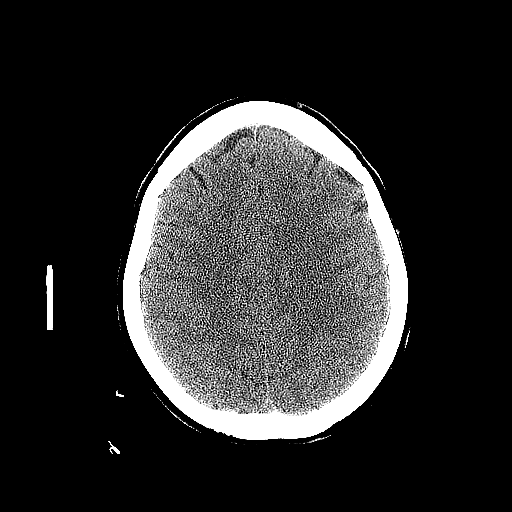
[im 63/80  brain]
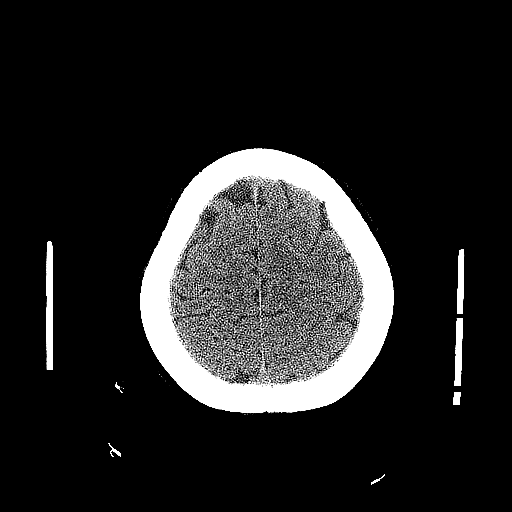
[im 63/80  bone]
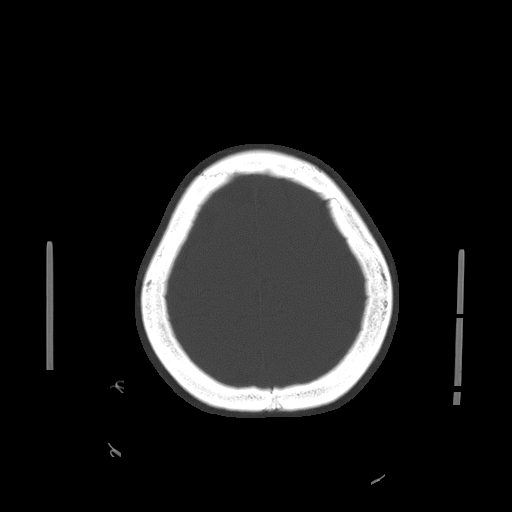
[im 67/80  brain]
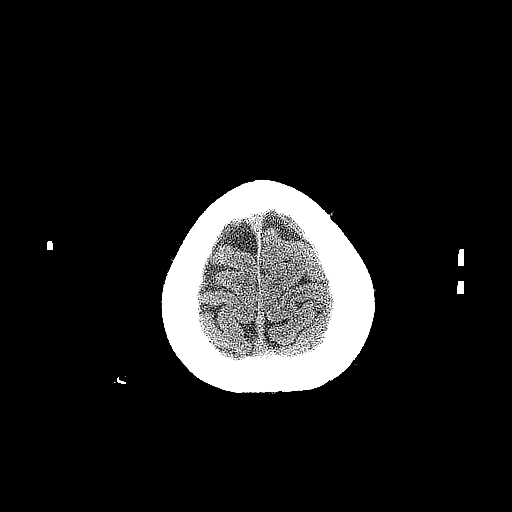
[im 71/80  brain]
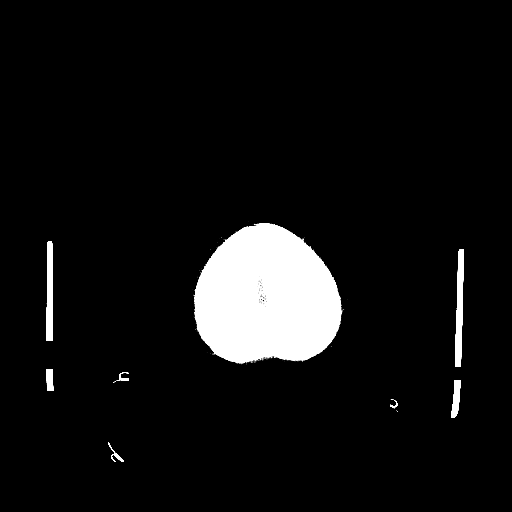
[im 75/80  brain]
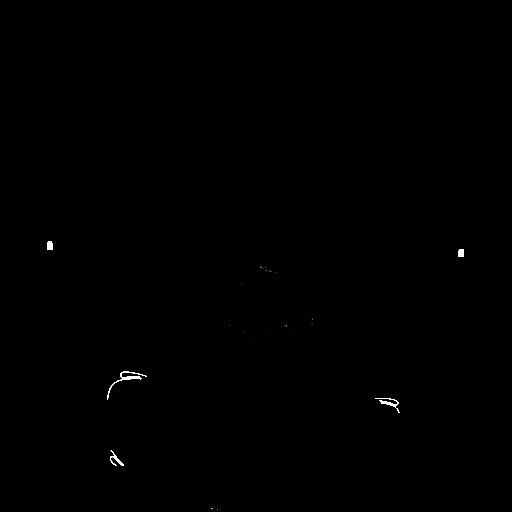

[16 of 30 positions shown; findings below may reference images not displayed]

FINDINGS: No evidence of parenchymal hemorrhage or extra-axial
fluid collection. No mass lesion, mass effect, or midline shift.

No CT evidence of acute infarction.

Cerebral volume is age appropriate.  No ventriculomegaly.

The visualized paranasal sinuses are essentially clear. The mastoid
air cells are unopacified.

No evidence of calvarial fracture.
IMPRESSION: Normal head CT.

## 2013-07-14 ENCOUNTER — Other Ambulatory Visit: Payer: Self-pay | Admitting: *Deleted

## 2013-07-14 DIAGNOSIS — IMO0002 Reserved for concepts with insufficient information to code with codable children: Secondary | ICD-10-CM

## 2013-07-15 ENCOUNTER — Other Ambulatory Visit: Payer: Self-pay | Admitting: Internal Medicine

## 2013-07-15 DIAGNOSIS — IMO0002 Reserved for concepts with insufficient information to code with codable children: Secondary | ICD-10-CM

## 2013-07-15 MED ORDER — OXYCODONE HCL 10 MG PO TABS: 10.0000 mg | ORAL_TABLET | Freq: Four times a day (QID) | ORAL | Status: DC | PRN

## 2013-07-15 NOTE — Telephone Encounter (Signed)
Pt aware.

## 2013-07-17 ENCOUNTER — Ambulatory Visit (INDEPENDENT_AMBULATORY_CARE_PROVIDER_SITE_OTHER): Payer: Medicaid Other | Admitting: Obstetrics and Gynecology

## 2013-07-17 ENCOUNTER — Other Ambulatory Visit (HOSPITAL_COMMUNITY)
Admission: RE | Admit: 2013-07-17 | Discharge: 2013-07-17 | Disposition: A | Discharge status: Home / Self Care | Payer: Medicaid Other | Source: Ambulatory Visit | Attending: Obstetrics and Gynecology | Admitting: Obstetrics and Gynecology

## 2013-07-17 ENCOUNTER — Encounter: Payer: Self-pay | Admitting: Obstetrics and Gynecology

## 2013-07-17 VITALS — BP 133/85 | HR 75 | Temp 97.4°F | Ht 59.0 in | Wt 129.0 lb

## 2013-07-17 DIAGNOSIS — Z1151 Encounter for screening for human papillomavirus (HPV): Secondary | ICD-10-CM | POA: Insufficient documentation

## 2013-07-17 DIAGNOSIS — Z124 Encounter for screening for malignant neoplasm of cervix: Secondary | ICD-10-CM

## 2013-07-17 DIAGNOSIS — R87810 Cervical high risk human papillomavirus (HPV) DNA test positive: Secondary | ICD-10-CM | POA: Insufficient documentation

## 2013-07-17 DIAGNOSIS — Z01419 Encounter for gynecological examination (general) (routine) without abnormal findings: Secondary | ICD-10-CM | POA: Insufficient documentation

## 2013-07-17 DIAGNOSIS — Z113 Encounter for screening for infections with a predominantly sexual mode of transmission: Secondary | ICD-10-CM | POA: Insufficient documentation

## 2013-07-17 NOTE — Progress Notes (Signed)
Patient ID: Victoria Ellis, female   DOB: 1976-09-01, 37 y.o.   MRN: [id number] 37 yo here for cervical cancer screening. Patient without any complaints. Patient reports history of abnormal pap smear. Her last pap smear was in 2009 and was normal. Patient states she has not had a pap smear since 2009. Patient without any complaints. She is sexually active using BTL for contraception.  GENERAL: Well-developed, well-nourished female in no acute distress.  HEENT: Normocephalic, atraumatic. Sclerae anicteric.  NECK: Supple. Normal thyroid.  LUNGS: Clear to auscultation bilaterally.  HEART: Regular rate and rhythm. BREASTS: Symmetric in size. No palpable masses or lymphadenopathy, skin changes, or nipple drainage. ABDOMEN: Soft, nontender, nondistended. No organomegaly. PELVIC: Normal external female genitalia. Vagina is pink and rugated.  Normal discharge. Normal appearing cervix. Uterus is normal in size. No adnexal mass or tenderness. EXTREMITIES: No cyanosis, clubbing, or edema, 2+ distal pulses.  A/P 37 yo here for annual exam - Pap smear collected - patient advised to perform monthly self breast and vulva exam - patient will be contacted with any abnormal results - rtc in 1 year or prn

## 2013-07-17 NOTE — Progress Notes (Signed)
Pt. Reports having an abnormal pap smear in the past; refferred by [facility].

## 2013-08-11 ENCOUNTER — Other Ambulatory Visit: Payer: Self-pay | Admitting: *Deleted

## 2013-08-11 DIAGNOSIS — IMO0002 Reserved for concepts with insufficient information to code with codable children: Secondary | ICD-10-CM

## 2013-08-11 MED ORDER — OXYCODONE HCL 10 MG PO TABS: 10.0000 mg | ORAL_TABLET | Freq: Four times a day (QID) | ORAL | Status: DC | PRN

## 2013-08-12 NOTE — Telephone Encounter (Signed)
Rx ready - pt was called.

## 2013-08-25 ENCOUNTER — Encounter: Payer: Medicaid Other | Admitting: Internal Medicine

## 2013-08-28 ENCOUNTER — Other Ambulatory Visit: Payer: Self-pay

## 2013-09-08 ENCOUNTER — Other Ambulatory Visit: Payer: Self-pay | Admitting: *Deleted

## 2013-09-08 ENCOUNTER — Other Ambulatory Visit: Payer: Self-pay | Admitting: Internal Medicine

## 2013-09-08 DIAGNOSIS — IMO0002 Reserved for concepts with insufficient information to code with codable children: Secondary | ICD-10-CM

## 2013-09-08 NOTE — Telephone Encounter (Signed)
Zolpidem rx called to [facility]. 

## 2013-09-09 MED ORDER — OXYCODONE HCL 10 MG PO TABS: 10.0000 mg | ORAL_TABLET | Freq: Four times a day (QID) | ORAL | Status: DC | PRN

## 2013-09-11 NOTE — Telephone Encounter (Signed)
Rx ready to be pick up; pt was called.

## 2013-10-06 ENCOUNTER — Other Ambulatory Visit: Payer: Self-pay | Admitting: *Deleted

## 2013-10-06 DIAGNOSIS — IMO0002 Reserved for concepts with insufficient information to code with codable children: Secondary | ICD-10-CM

## 2013-10-06 NOTE — Telephone Encounter (Signed)
Last refill 11/17 Call when ready @ [phone]

## 2013-10-07 MED ORDER — OXYCODONE HCL 10 MG PO TABS: 10.0000 mg | ORAL_TABLET | Freq: Four times a day (QID) | ORAL | Status: DC | PRN

## 2013-10-27 ENCOUNTER — Other Ambulatory Visit: Payer: Self-pay | Admitting: Internal Medicine

## 2013-11-04 NOTE — Telephone Encounter (Signed)
Rx called in 

## 2013-11-05 ENCOUNTER — Other Ambulatory Visit: Payer: Self-pay | Admitting: *Deleted

## 2013-11-05 DIAGNOSIS — IMO0002 Reserved for concepts with insufficient information to code with codable children: Secondary | ICD-10-CM

## 2013-11-06 MED ORDER — OXYCODONE HCL 10 MG PO TABS: 10.0000 mg | ORAL_TABLET | Freq: Four times a day (QID) | ORAL | Status: DC | PRN

## 2013-11-06 NOTE — Telephone Encounter (Signed)
Pt.notified

## 2013-11-07 ENCOUNTER — Other Ambulatory Visit: Payer: Self-pay | Admitting: Internal Medicine

## 2013-11-07 DIAGNOSIS — IMO0002 Reserved for concepts with insufficient information to code with codable children: Secondary | ICD-10-CM

## 2013-11-07 MED ORDER — OXYCODONE HCL 10 MG PO TABS: 10.0000 mg | ORAL_TABLET | Freq: Four times a day (QID) | ORAL | Status: DC | PRN

## 2013-11-27 ENCOUNTER — Other Ambulatory Visit: Payer: Self-pay | Admitting: Internal Medicine

## 2013-11-28 NOTE — Telephone Encounter (Signed)
Tramadol rx called to [facility]. 

## 2013-12-31 ENCOUNTER — Other Ambulatory Visit: Payer: Self-pay | Admitting: Internal Medicine

## 2014-01-01 NOTE — Telephone Encounter (Signed)
Tramadol rx called to [facility]. 

## 2014-01-20 ENCOUNTER — Other Ambulatory Visit: Payer: Self-pay | Admitting: Internal Medicine

## 2014-01-22 NOTE — Telephone Encounter (Signed)
Called to pharm 

## 2014-01-26 ENCOUNTER — Other Ambulatory Visit: Payer: Self-pay | Admitting: Internal Medicine

## 2014-02-02 ENCOUNTER — Other Ambulatory Visit: Payer: Self-pay | Admitting: *Deleted

## 2014-02-02 DIAGNOSIS — IMO0002 Reserved for concepts with insufficient information to code with codable children: Secondary | ICD-10-CM

## 2014-02-03 ENCOUNTER — Other Ambulatory Visit: Payer: Self-pay | Admitting: Internal Medicine

## 2014-02-03 DIAGNOSIS — IMO0002 Reserved for concepts with insufficient information to code with codable children: Secondary | ICD-10-CM

## 2014-02-03 MED ORDER — OXYCODONE HCL 10 MG PO TABS: 10.0000 mg | ORAL_TABLET | Freq: Four times a day (QID) | ORAL | Status: DC | PRN

## 2014-02-03 NOTE — Progress Notes (Signed)
Pt informed Rx is ready 

## 2014-02-05 NOTE — Telephone Encounter (Signed)
Pt aware Rx is ready. 

## 2014-02-10 ENCOUNTER — Ambulatory Visit: Payer: Medicaid Other | Admitting: Internal Medicine

## 2014-02-20 ENCOUNTER — Other Ambulatory Visit: Payer: Self-pay | Admitting: Internal Medicine

## 2014-02-24 ENCOUNTER — Other Ambulatory Visit: Payer: Self-pay | Admitting: Internal Medicine

## 2014-02-24 NOTE — Telephone Encounter (Signed)
Tramadol rx called to [facility] pharmacy. 

## 2014-03-04 ENCOUNTER — Other Ambulatory Visit: Payer: Self-pay | Admitting: *Deleted

## 2014-03-04 DIAGNOSIS — IMO0002 Reserved for concepts with insufficient information to code with codable children: Secondary | ICD-10-CM

## 2014-03-05 ENCOUNTER — Other Ambulatory Visit: Payer: Self-pay | Admitting: Internal Medicine

## 2014-03-05 DIAGNOSIS — IMO0002 Reserved for concepts with insufficient information to code with codable children: Secondary | ICD-10-CM

## 2014-03-05 MED ORDER — OXYCODONE HCL 10 MG PO TABS: 10.0000 mg | ORAL_TABLET | Freq: Four times a day (QID) | ORAL | Status: DC | PRN

## 2014-03-05 NOTE — Telephone Encounter (Signed)
Pt will need clinic visit for next refill.  Last clinic visit July 2014 followed by 2 no-shows.

## 2014-03-20 ENCOUNTER — Other Ambulatory Visit: Payer: Self-pay | Admitting: Internal Medicine

## 2014-03-23 NOTE — Telephone Encounter (Signed)
Tramadol rx called to [facility]. 

## 2014-04-03 ENCOUNTER — Ambulatory Visit (INDEPENDENT_AMBULATORY_CARE_PROVIDER_SITE_OTHER): Payer: Medicaid Other | Admitting: Internal Medicine

## 2014-04-03 ENCOUNTER — Encounter: Payer: Self-pay | Admitting: Internal Medicine

## 2014-04-03 VITALS — BP 135/82 | HR 92 | Temp 98.8°F | Ht 59.0 in | Wt 119.5 lb

## 2014-04-03 DIAGNOSIS — IMO0002 Reserved for concepts with insufficient information to code with codable children: Secondary | ICD-10-CM

## 2014-04-03 MED ORDER — OXYCODONE HCL 10 MG PO TABS: 10.0000 mg | ORAL_TABLET | Freq: Four times a day (QID) | ORAL | Status: DC | PRN

## 2014-04-03 MED ORDER — PREGABALIN 50 MG PO CAPS: 50.0000 mg | ORAL_CAPSULE | Freq: Two times a day (BID) | ORAL | Status: DC

## 2014-04-03 NOTE — Patient Instructions (Addendum)
General Instructions:  1. Please schedule a follow up appointment for 3-6 months.  Return to [facility] for further evaluation of back pain and left leg dysfunction.  2. Please take all medications as prescribed.   3. If you have worsening of your symptoms or new symptoms arise, please call the clinic [phone]), or go to the ER immediately if symptoms are severe.   Please bring your medicines with you each time you come to clinic.  Medicines may include prescription medications, over-the-counter medications, herbal remedies, eye drops, vitamins, or other pills.   Progress Toward Treatment Goals:  Treatment Goal 04/03/2014  Prevent falls at goal    Self Care Goals & Plans:  Self Care Goal 04/03/2014  Manage my medications take my medicines as prescribed; bring my medications to every visit; refill my medications on time  Eat healthy foods drink diet soda or water instead of juice or soda; eat more vegetables; eat foods that are low in salt; eat baked foods instead of fried foods; eat smaller portions  Be physically active find an activity I enjoy; take a walk every day  Meeting treatment goals maintain the current self-care plan    No flowsheet data found.   Care Management & Community Referrals:  Referral 04/03/2014  Referrals made for care management support none needed  Referrals made to community resources none

## 2014-04-03 NOTE — Progress Notes (Signed)
Subjective:   Patient ID: Victoria Ellis female   DOB: [birth year] 38 y.o.   MRN: [id number]  HPI: Victoria Ellis is a 38 y.o. female w/ PMHx of chronic back pain w/ radiculopathy s/p laminectomy, presents to the clinic today for a follow-up visit. Patient has not been seen in the clinic since 05/20/13. Today she continues to complain of lower back pain that is dull in nature, radiating down her legs bilaterally (L>R) and around her abdomen, which she describes as a sharp stabbing pain. This has been going on for years, but she claims it has become worse over the past several months. She uses Oxycodone 10 mg q6h + Tramadol for pain which she claims helps. The patient also says she has worsening of her LLE function, saying that she frequently trips w/ her left foot and claims that she has "foot drop". Victoria Ellis claims that on 04/02/14, she tripped in her house and fell, hitting her head on the sink. Her significant other called EMS who evaluated her at her home and felt that she was stable w/ no significant issues. Today, she has a small left periorbital bruise, but otherwise denies any significant headache, drowsiness, confusion, LOC, or seizures. She also denies bowel or bladder incontinence and saddle anesthesia.  On exam, patient w/ bruising over the left orbit, small laceration on inside of left upper lip and left side of tongue. Also w/ tenderness over the lumbar spine, mildly positive straight leg raise bilaterally, and decreased ability to dorsiflex the left foot and well as decreased sensation. However, when patient was observed walking out of the clinic, she was able to walk w/ adequate dorsiflexion w/ no gait alterations to account for foot drop (such as high stepping w/ the left foot).  Patient also has issues w/ anxiety and depression and claims that she is seeing a new psychiatrist for this. She was previously taking Zoloft but is now taking a different medication, something that she does  not remember the name of. Additionally, the patient does not recall the name of her physician or where they work. She says her anxiety and depression are well controlled at this time, she denies significant sadness, decreased energy, difficulty concentrating, sleep disturbance, or change in appetite.   Past Medical History  Diagnosis Date  . Chronic back pain 2000      1.  Postoperative changes left hemilaminotomy L5-S1.  Marland Kitchen. Foot drop     left foot  . Abnormal Pap smear   . Nerve damage     bilateral legs    Current Outpatient Prescriptions  Medication Sig Dispense Refill  . albuterol (PROVENTIL HFA;VENTOLIN HFA) 108 (90 BASE) MCG/ACT inhaler Inhale 2 puffs into the lungs 3 (three) times daily as needed for wheezing or shortness of breath.      . diphenhydrAMINE (BENADRYL) 25 MG tablet Take 25 mg by mouth at bedtime as needed for sleep.      . naproxen (NAPROSYN) 500 MG tablet Take 1 tablet (500 mg total) by mouth 2 (two) times daily with a meal.  60 tablet  1  . Oxycodone HCl 10 MG TABS Take 1 tablet (10 mg total) by mouth every 6 (six) hours as needed.  120 tablet  0  . pantoprazole (PROTONIX) 20 MG tablet Take 1 tablet (20 mg total) by mouth daily.  30 tablet  3  . pregabalin (LYRICA) 50 MG capsule Take 1 capsule (50 mg total) by mouth 2 (two) times daily.  60 capsule  5  . traMADol (ULTRAM) 50 MG tablet TAKE 1 TABLET BY MOUTH EVERY 8 HOURS AS NEEDED FOR PAIN  90 tablet  0  . zolpidem (AMBIEN) 5 MG tablet TAKE 1 TABLET BY MOUTH ONCE DAILY AT BEDTIME AS NEEDED FOR SLEEP  30 tablet  0   No current facility-administered medications for this visit.   Family History  Problem Relation Age of Onset  . Hypertension Mother   . Alcohol abuse Mother   . Epilepsy Daughter    History   Social History  . Marital Status: Single    Spouse Name: N/A    Number of Children: N/A  . Years of Education: N/A   Social History Main Topics  . Smoking status: Never Smoker   . Smokeless tobacco: Never  Used  . Alcohol Use: No  . Drug Use: No  . Sexual Activity: Yes    Birth Control/ Protection: Surgical   Other Topics Concern  . None   Social History Narrative  . None    Review of Systems: General: Denies fever, chills, diaphoresis, appetite change and fatigue.  Respiratory: Denies SOB, DOE, cough, chest tightness, and wheezing.   Cardiovascular: Denies chest pain and palpitations.  Gastrointestinal: Denies nausea, vomiting, abdominal pain, diarrhea, constipation, blood in stool and abdominal distention.  Genitourinary: Denies dysuria, urgency, frequency, hematuria, and flank pain. Endocrine: Denies hot or cold intolerance, polyuria, and polydipsia. Musculoskeletal: Positive for back pain and gait problem.  Denies myalgias, joint swelling, arthralgias.  Skin: Denies pallor, rash and wounds.  Neurological: Positive for lower extremity weakness, and numbness (L>R). Denies dizziness, seizures, syncope, lightheadedness, and headaches.  Psychiatric/Behavioral: Denies mood changes, confusion, nervousness, sleep disturbance and agitation.  Objective:   Physical Exam: Filed Vitals:   04/03/14 1527  BP: 135/82  Pulse: 92  Temp: 98.8 F (37.1 C)  TempSrc: Oral  Height: 4\' 11"  (1.499 m)  Weight: 119 lb 8 oz (54.205 kg)  SpO2: 95%   General: Vital signs reviewed.  Patient is a well-developed and well-nourished, in no acute distress and cooperative with exam. Walks w/ a cane.  HEENT: Normocephalic and atraumatic. PERRL, EOMI, conjunctivae normal, No scleral icterus. Left upper orbital bruise w/ mils tenderness to palpation. Small laceration to left upper lip and left side of tongue, healing. No active bleeding, drainage, or erythema. Teeth intact throughout.  Neck: Supple, trachea midline, normal ROM, No JVD, masses, thyromegaly, or carotid bruit present.  Cardiovascular: RRR, S1 normal, S2 normal, no murmurs, gallops, or rubs. Pulmonary/Chest: Air entry equal bilaterally, no wheezes,  rales, or rhonchi. Abdominal: Soft, non-tender, non-distended, BS +, no masses, organomegaly, or guarding present.  Musculoskeletal: Tenderness over the lumbar spine, 8 cm midline scar w/ overlying tattoo. No joint deformities, erythema, or stiffness. Passive ROM intact in bilateral LE's somewhat limited by pain. LLE w/ decreased ROM w/ dorsiflexion. UE ROM intact bilaterally.  Extremities: No swelling or edema, pulses symmetric and intact bilaterally. No cyanosis or clubbing. Neurological: A&O x3, cranial nerve II-XII are grossly intact. Strength 4/5 in LLE w/ dorsiflexion, 5/5 in RLE. UE's 5/5 bilaterally. Decreased sensation in LLE distal to the knee, otherwise no sensory abnormalities.  Skin: Warm, dry and intact. No rashes or erythema. Psychiatric: Normal mood, flat affect. Speech and behavior is normal. Cognition and memory are normal.   Assessment & Plan:   Please see problem based assessment and plan.

## 2014-04-04 LAB — PRESCRIPTION ABUSE MONITORING 15P, URINE
Amphetamine/Meth: NEGATIVE ng/mL
Barbiturate Screen, Urine: NEGATIVE ng/mL
Benzodiazepine Screen, Urine: NEGATIVE ng/mL
Buprenorphine, Urine: NEGATIVE ng/mL
CANNABINOID SCRN UR: NEGATIVE ng/mL
COCAINE METABOLITES: NEGATIVE ng/mL
Carisoprodol, Urine: NEGATIVE ng/mL
Creatinine, Urine: 400.07 mg/dL (ref >20.0)
Fentanyl, Ur: NEGATIVE ng/mL
Meperidine, Ur: NEGATIVE ng/mL
Methadone Screen, Urine: NEGATIVE ng/mL
PROPOXYPHENE: NEGATIVE ng/mL
ZOLPIDEM, URINE: NEGATIVE ng/mL

## 2014-04-06 ENCOUNTER — Encounter: Payer: Self-pay | Admitting: Internal Medicine

## 2014-04-06 NOTE — Assessment & Plan Note (Signed)
Patient w/ continued dull aching pain over lumbar spine. Patient has had laminectomy in the past w/ documented radiculopathy. Now w/ decreased function in the LLE and worsened foot drop, according to the patient, resulting in increased falls, as recently as yesterday. Patient sustained mild head injury w/ bruising over her left orbit, and small bite laceration to left upper lip and tongue. No LOC, no pre-syncopal symptoms. Patient claims she tripped 2/2 her LLE weakness. No alarm signs or symptoms s/p head trauma. Denies headache, drowsiness or lethargy, change in vision, bleeding, or confusion. PERRL. Only w/ mild tenderness over the left orbit. On exam, patient w/ difficulty w/ dorsiflexion of the left foot as well as sensory losses distal to the knee. Patient walks w/ a cane. When observed walking out of the clinic, patient did appear to have adequate dorsiflexion w/ the left foot. [facility], had previously told the patient that she is not a candidate for further surgical intervention. However, given radicular pain in addition to worsened LLE function, it would be reasonable for patient to return to Ortho and may also be a candidate for epidural steroid injection.  -Ortho referral ([facility]) -Refill Lyrica 50 mg bid (patient claims she has not had this in a while but it helps w/ her pain) -Refill Oxycodone 10 mg q6h prn #120/month according to pain contract -UDS performed, positive for opiates, specifically oxycodone and tramadol as patient had previously described

## 2014-04-06 NOTE — Progress Notes (Signed)
Case discussed with Dr. Jones at the time of the visit.  We reviewed the resident's history and exam and pertinent patient test results.  I agree with the assessment, diagnosis, and plan of care documented in the resident's note. 

## 2014-04-07 ENCOUNTER — Ambulatory Visit (INDEPENDENT_AMBULATORY_CARE_PROVIDER_SITE_OTHER): Payer: Medicaid Other | Admitting: Internal Medicine

## 2014-04-07 ENCOUNTER — Encounter: Payer: Self-pay | Admitting: Internal Medicine

## 2014-04-07 VITALS — BP 138/92 | HR 92 | Temp 98.7°F | Resp 20 | Ht 60.0 in | Wt 121.9 lb

## 2014-04-07 DIAGNOSIS — K140 Glossitis: Secondary | ICD-10-CM | POA: Insufficient documentation

## 2014-04-07 LAB — OXYCODONE, URINE (LC/MS-MS)
Noroxycodone, Ur: 1104 ng/mL — ABNORMAL HIGH (ref <50)
OXYCODONE, UR: 446 ng/mL — AB (ref <50)
Oxymorphone: 1348 ng/mL — ABNORMAL HIGH (ref <50)

## 2014-04-07 LAB — OPIATES/OPIOIDS (LC/MS-MS)
Codeine Urine: NEGATIVE ng/mL (ref <50)
HYDROCODONE: NEGATIVE ng/mL (ref <50)
Hydromorphone: NEGATIVE ng/mL (ref <50)
MORPHINE: NEGATIVE ng/mL (ref <50)
Norhydrocodone, Ur: NEGATIVE ng/mL (ref <50)
Noroxycodone, Ur: 1104 ng/mL — ABNORMAL HIGH (ref <50)
Oxycodone, ur: 446 ng/mL — ABNORMAL HIGH (ref <50)
Oxymorphone: 1348 ng/mL — ABNORMAL HIGH (ref <50)

## 2014-04-07 LAB — TRAMADOL, URINE
N-DESMETHYL-CIS-TRAMADOL: 3557 ng/mL — AB (ref <100)
TRAMADOL, URINE: 3954 ng/mL — AB (ref <100)

## 2014-04-07 MED ORDER — LIDOCAINE VISCOUS 2 % MT SOLN: OROMUCOSAL | Status: DC

## 2014-04-07 MED ORDER — CLINDAMYCIN HCL 300 MG PO CAPS: 300.0000 mg | ORAL_CAPSULE | Freq: Four times a day (QID) | ORAL | Status: DC

## 2014-04-07 NOTE — Assessment & Plan Note (Addendum)
S/P Superficial laceration of 0.5 cm involving the left lateral aspect of the anterior half of the tongue secondary to fall on 04/02/14. Symptoms and physical exam findings suggestive of a possible local super added infection with no sign of swelling of the tongue or infection spreading to the deeper layers of the tongue. The yellowish film covering the lacerated area was carefully removed with a sterile surgical forceps and sent for culture and sensitivity. Discussed with the attending regarding further management and plan.  Plans: Empiric antibiotic therapy with Clindamycin 300 mg QID x 10 days. Topical Lidocaine 2% oral solution to help with pain. Recommended to seek immediate medical attention if she notices swelling of the tongue or worsening of the pain. Follow up in a week to see the progress. If the symptoms/signs are worsening, will consider ENT referral.

## 2014-04-07 NOTE — Patient Instructions (Signed)
Take the antibiotic and the pain medicine as directed. If your symptoms worsen, or notice severe pain/swelling of the tongue, seek immediate medical help.

## 2014-04-07 NOTE — Progress Notes (Signed)
Subjective:   Patient ID: Victoria Ellis female   DOB: December 18, 1975 38 y.o.   MRN: [id number]  HPI: [name] is a 38 y.o. woman PMH significant for chronic low back pain comes to the office with CC of worsening tongue pain over the last 3 days.  Patient apparently had a mechanical fall on 04/02/14 and bruised her left periorbital area and sustained laceration of the tongue and upper lip. Patient was seen here in the clinic on 04/03/14 and the lacerations were documented and managed conservatively. Patient reports that the laceration over the tongue is getting more painful and coated with yellowish colored film. She also reports that the pain is intolerable and reports foul smell to her breath. She reports using mouth wash daily without any relief. She reports that the Oxycodone is not helping her pain at all. She denies any fever, chills, body pains. She denies any nausea, vomiting, diarrhea. She is into tears during the interview because of the pain.   She denies any other complaints.  Past Medical History  Diagnosis Date  . Chronic back pain 2000      1.  Postoperative changes left hemilaminotomy L5-S1.  Marland Kitchen. Foot drop     left foot  . Abnormal Pap smear   . Nerve damage     bilateral legs    Current Outpatient Prescriptions  Medication Sig Dispense Refill  . albuterol (PROVENTIL HFA;VENTOLIN HFA) 108 (90 BASE) MCG/ACT inhaler Inhale 2 puffs into the lungs 3 (three) times daily as needed for wheezing or shortness of breath.      . diphenhydrAMINE (BENADRYL) 25 MG tablet Take 25 mg by mouth at bedtime as needed for sleep.      . naproxen (NAPROSYN) 500 MG tablet Take 1 tablet (500 mg total) by mouth 2 (two) times daily with a meal.  60 tablet  1  . Oxycodone HCl 10 MG TABS Take 1 tablet (10 mg total) by mouth every 6 (six) hours as needed.  120 tablet  0  . pantoprazole (PROTONIX) 20 MG tablet Take 1 tablet (20 mg total) by mouth daily.  30 tablet  3  . pregabalin (LYRICA) 50 MG  capsule Take 1 capsule (50 mg total) by mouth 2 (two) times daily.  60 capsule  5  . traMADol (ULTRAM) 50 MG tablet TAKE 1 TABLET BY MOUTH EVERY 8 HOURS AS NEEDED FOR PAIN  90 tablet  0  . zolpidem (AMBIEN) 5 MG tablet TAKE 1 TABLET BY MOUTH ONCE DAILY AT BEDTIME AS NEEDED FOR SLEEP  30 tablet  0   No current facility-administered medications for this visit.   Family History  Problem Relation Age of Onset  . Hypertension Mother   . Alcohol abuse Mother   . Epilepsy Daughter    History   Social History  . Marital Status: Single    Spouse Name: N/A    Number of Children: N/A  . Years of Education: N/A   Social History Main Topics  . Smoking status: Never Smoker   . Smokeless tobacco: Never Used  . Alcohol Use: No  . Drug Use: No  . Sexual Activity: Yes    Birth Control/ Protection: Surgical   Other Topics Concern  . None   Social History Narrative  . None   Review of Systems: Pertinent items are noted in HPI. Objective:  Physical Exam: Filed Vitals:   04/07/14 1607  BP: 138/92  Pulse: 92  Temp: 98.7 F (37.1 C)  TempSrc: Oral  Resp: 20  Height: 5' (1.524 m)  Weight: 121 lb 14.4 oz (55.293 kg)  SpO2: 100%   Constitutional: Vital signs reviewed.  Patient is a well-developed and well-nourished and is in no acute distress and cooperative with exam.  Alert and oriented x3.  Head: Left periorbital bruise noted and in the later stages of resolution. Mouth: Small laceration of <0.5cm noted over the inner aspect of upper lip. Small superficial laceration of 0.5 cm noted over the left lateral side of the anterior aspect of the tongue. There is no swelling of the tongue noted. Mild erythema noted surrounding the lacerated area. There is a yellowish film coated over the lacerated area of the otngue. With a sterile surgical forceps, the film was removed and sent for culture and sensitivity. Patient was in pain during this exam but tolerated it very well.   Cardiovascular: RRR,  S1 normal, S2 normal, no MRG Pulmonary/Chest: normal respiratory effort, CTAB, no wheezes, rales, or rhonchi Hematology: no cervical, inginal, or axillary adenopathy.  Neurological: A&O x3  Psychiatric: Normal mood and affect.   Assessment & Plan:

## 2014-04-08 NOTE — Progress Notes (Signed)
INTERNAL MEDICINE TEACHING ATTENDING ADDENDUM - Nischal Narendra, MD: I reviewed and discussed with the resident Dr. Boggala, the patient's medical history, physical examination, diagnosis and results of pertinent tests and treatment and I agree with the patient's care as documented. 

## 2014-04-09 LAB — WOUND CULTURE
GRAM STAIN: NONE SEEN
Gram Stain: NONE SEEN
Organism ID, Bacteria: NORMAL

## 2014-04-28 ENCOUNTER — Other Ambulatory Visit: Payer: Self-pay | Admitting: *Deleted

## 2014-04-28 DIAGNOSIS — IMO0002 Reserved for concepts with insufficient information to code with codable children: Secondary | ICD-10-CM

## 2014-04-29 MED ORDER — OXYCODONE HCL 10 MG PO TABS: 10.0000 mg | ORAL_TABLET | Freq: Four times a day (QID) | ORAL | Status: DC | PRN

## 2014-05-05 ENCOUNTER — Other Ambulatory Visit: Payer: Self-pay | Admitting: *Deleted

## 2014-05-06 NOTE — Telephone Encounter (Signed)
Denied. Pt on contract for oxycodone and not tramadol. Plus there is not documentation for tramadol need. And she has sz on her problem list. May need appt to discuss pain.

## 2014-05-19 ENCOUNTER — Telehealth: Payer: Self-pay | Admitting: *Deleted

## 2014-05-19 NOTE — Telephone Encounter (Signed)
[facility] Lyrica 50 mg capsules # 60.  Approved 05/20/2015 thru 05/14/2015 approval code [id number].  Approval was called to [facility] at [phone].  Angelina OkGladys Shavonta Gossen, RN 05/19/2014 1:48 PM.

## 2014-06-01 ENCOUNTER — Encounter: Payer: Medicaid Other | Admitting: Internal Medicine

## 2014-07-27 ENCOUNTER — Other Ambulatory Visit: Payer: Self-pay | Admitting: *Deleted

## 2014-07-27 DIAGNOSIS — M549 Dorsalgia, unspecified: Secondary | ICD-10-CM

## 2014-07-28 MED ORDER — OXYCODONE HCL 10 MG PO TABS: 10.0000 mg | ORAL_TABLET | Freq: Four times a day (QID) | ORAL | Status: DC | PRN

## 2014-07-29 ENCOUNTER — Telehealth: Payer: Self-pay | Admitting: *Deleted

## 2014-08-04 NOTE — Telephone Encounter (Signed)
Open by error

## 2014-08-07 ENCOUNTER — Other Ambulatory Visit: Payer: Self-pay

## 2014-08-23 HISTORY — PX: MULTIPLE TOOTH EXTRACTIONS: SHX2053

## 2014-08-24 ENCOUNTER — Encounter: Payer: Self-pay | Admitting: Internal Medicine

## 2014-09-30 ENCOUNTER — Other Ambulatory Visit (HOSPITAL_COMMUNITY): Payer: Self-pay | Admitting: Orthopaedic Surgery

## 2014-10-05 ENCOUNTER — Inpatient Hospital Stay (HOSPITAL_COMMUNITY)
Admission: RE | Admit: 2014-10-05 | Discharge: 2014-10-05 | Disposition: A | Discharge status: Home / Self Care | Payer: Medicaid Other | Source: Ambulatory Visit

## 2014-10-05 NOTE — Pre-Procedure Instructions (Signed)
Victoria GlasgowBarbara J Ellis  10/05/2014   Your procedure is scheduled on:  Wednesday October 07, 2014 at 1520 PM  Report to [facility] Admitting at 1320 PM.  Call this number if you have problems the morning of surgery: [phone]   Remember:   Do not eat food or drink liquids after midnight Tuesday 10/06/14   Take these medicines the morning of surgery with A SIP OF WATER: Use and bring inhaler day of surgery. Protonix, Lyrica, and Pain medication if needed.   Do not wear jewelry, make-up or nail polish.  Do not wear lotions, powders, or perfumes. You may wear deodorant.  Do not shave 48 hours prior to surgery.   Do not bring valuables to the hospital.  [facility] is not responsible                  for any belongings or valuables.               Contacts, dentures or bridgework may not be worn into surgery.  Leave suitcase in the car. After surgery it may be brought to your room.  For patients admitted to the hospital, discharge time is determined by your                treatment team.               Patients discharged the day of surgery will not be allowed to drive  home.    Special Instructions: [place] - Preparing for Surgery  Before surgery, you can play an important role.  Because skin is not sterile, your skin needs to be as free of germs as possible.  You can reduce the number of germs on you skin by washing with CHG (chlorahexidine gluconate) soap before surgery.  CHG is an antiseptic cleaner which kills germs and bonds with the skin to continue killing germs even after washing.  Please DO NOT use if you have an allergy to CHG or antibacterial soaps.  If your skin becomes reddened/irritated stop using the CHG and inform your nurse when you arrive at Short Stay.  Do not shave (including legs and underarms) for at least 48 hours prior to the first CHG shower.  You may shave your face.  Please follow these instructions carefully:   1.  Shower with CHG Soap the night  before surgery and the                                morning of Surgery.  2.  If you choose to wash your hair, wash your hair first as usual with your       normal shampoo.  3.  After you shampoo, rinse your hair and body thoroughly to remove the                      Shampoo.  4.  Use CHG as you would any other liquid soap.  You can apply chg directly       to the skin and wash gently with scrungie or a clean washcloth.  5.  Apply the CHG Soap to your body ONLY FROM THE NECK DOWN.        Do not use on open wounds or open sores.  Avoid contact with your eyes,       ears, mouth and genitals (private parts).  Wash genitals (private parts)  with your normal soap.  6.  Wash thoroughly, paying special attention to the area where your surgery        will be performed.  7.  Thoroughly rinse your body with warm water from the neck down.  8.  DO NOT shower/wash with your normal soap after using and rinsing off       the CHG Soap.  9.  Pat yourself dry with a clean towel.            10.  Wear clean pajamas.            11.  Place clean sheets on your bed the night of your first shower and do not        sleep with pets.  Day of Surgery  Do not apply any lotions/deoderants the morning of surgery.  Please wear clean clothes to the hospital/surgery center.      Please read over the following fact sheets that you were given: Pain Booklet, Coughing and Deep Breathing, MRSA Information and Surgical Site Infection Prevention

## 2014-10-06 ENCOUNTER — Encounter (HOSPITAL_COMMUNITY): Payer: Self-pay | Admitting: *Deleted

## 2014-10-06 MED ORDER — CEFAZOLIN SODIUM-DEXTROSE 2-3 GM-% IV SOLR
2.0000 g | INTRAVENOUS | Status: AC
  Administered 2014-10-07: 2 g via INTRAVENOUS
  Filled 2014-10-06: qty 50

## 2014-10-06 NOTE — H&P (Signed)
  [facility]   A Division of [facility]   [address], [place], [place] [address] Telephone: [phone]  Fax: [phone]     PATIENT: Victoria Ellis, Chandra   MR#: [id number]  DOB: 1975/12/10   Visit Date: 09/22/2014      A 38 year old female returns for persistent problems with back pain, bilateral leg pain, calf pain, with footdrop that has been present for 4 months.  She had previous surgery with Dr. Ophelia CharterYates, 2002, with left-sided disk herniation.  Previous MRI 2014 shows some lateral recess stenosis, mild to moderate disk protrusion.  She has been taking oxycodone 10 mg 4 a day prescribed at [facility] outpatient.  Her previous treatment include Naprosyn, Lyrica.  She has used Ultram, also Ambien at night.  She is already taking 40 mg of oxycodone per day.     FAMILY HISTORY:  Positive for diabetes, heart disease, lung disease, ovarian cancer, hypertension.   SOCIAL HISTORY:  The patient is single.  She does not smoke.  Drinks 1-3 times per year.     REVIEW OF SYSTEMS:  Positive for anxiety, depression, asthma, migraines, possible sleep apnea.     PHYSICAL EXAMINATION:  The patient is 4 feet 11 inches, 115 pounds.  Alert and oriented, in moderate to severe distress.  She has significant problems ambulating.  Has a cane.  Has trace Achilles reflex on the left.  She has left partial footdrop with 4/5 anterior tib and EHL.  Toe flexors are strong.  Quads/hamstrings are normal.  Positive tension signs on the left, 45 degrees.  Positive popliteal compression test.     RADIOGRAPHS:  [facility], is reviewed.  This shows a large recurrent disk herniation, 2 x 1.5 x 2 cm, protruding in left lateral recess with severe compression.  Prior hemilaminectomy is present.  No change that would suggest infection.    ASSESSMENT:  Large recurrent L5-S1 HNP with footdrop.  Severe pain.     PLAN:  We discussed options.  Plan would be  microdiskectomy for recurrent HNP.  Risks of surgery discussed, including bleeding, infection, dural tear, re-operation and potential need for fusion.  It is hard to determine how much chronic pain she has had, as she has been on so much pain medication now for 13 years.  Risks of surgery discussed.  She understands, requests we proceed.     For additional information please see handwritten notes, reports, orders and prescriptions in this chart.      Callen Vancuren C. Ophelia CharterYates, M.D.    Auto-Authenticated by Veverly FellsMark C. Ophelia CharterYates, M.D.  MCY/sw DD: 09/24/2014  DT: 09/25/2014

## 2014-10-07 ENCOUNTER — Observation Stay (HOSPITAL_COMMUNITY)
Admission: RE | Admit: 2014-10-07 | Discharge: 2014-10-09 | Disposition: A | Discharge status: Home / Self Care | Payer: Medicaid Other | Source: Ambulatory Visit | Attending: Orthopaedic Surgery | Admitting: Orthopaedic Surgery

## 2014-10-07 ENCOUNTER — Ambulatory Visit (HOSPITAL_COMMUNITY): Payer: Medicaid Other | Admitting: Anesthesiology

## 2014-10-07 ENCOUNTER — Ambulatory Visit (HOSPITAL_COMMUNITY): Payer: Medicaid Other

## 2014-10-07 ENCOUNTER — Encounter (HOSPITAL_COMMUNITY): Payer: Self-pay | Admitting: *Deleted

## 2014-10-07 ENCOUNTER — Encounter (HOSPITAL_COMMUNITY)
Admission: RE | Disposition: A | Discharge status: Home / Self Care | Payer: Self-pay | Source: Ambulatory Visit | Attending: Orthopaedic Surgery

## 2014-10-07 DIAGNOSIS — Z888 Allergy status to other drugs, medicaments and biological substances status: Secondary | ICD-10-CM | POA: Diagnosis not present

## 2014-10-07 DIAGNOSIS — M5116 Intervertebral disc disorders with radiculopathy, lumbar region: Principal | ICD-10-CM | POA: Insufficient documentation

## 2014-10-07 DIAGNOSIS — Z9889 Other specified postprocedural states: Secondary | ICD-10-CM

## 2014-10-07 DIAGNOSIS — Z91013 Allergy to seafood: Secondary | ICD-10-CM | POA: Diagnosis not present

## 2014-10-07 HISTORY — DX: Major depressive disorder, single episode, unspecified: F32.9

## 2014-10-07 HISTORY — DX: Other complications of anesthesia, initial encounter: T88.59XA

## 2014-10-07 HISTORY — DX: Unspecified convulsions: R56.9

## 2014-10-07 HISTORY — DX: Depression, unspecified: F32.A

## 2014-10-07 HISTORY — PX: LUMBAR LAMINECTOMY: SHX95

## 2014-10-07 HISTORY — DX: Gastro-esophageal reflux disease without esophagitis: K21.9

## 2014-10-07 HISTORY — DX: Unspecified asthma, uncomplicated: J45.909

## 2014-10-07 HISTORY — DX: Anxiety disorder, unspecified: F41.9

## 2014-10-07 HISTORY — DX: Adverse effect of unspecified anesthetic, initial encounter: T41.45XA

## 2014-10-07 HISTORY — DX: Constipation, unspecified: K59.00

## 2014-10-07 LAB — COMPREHENSIVE METABOLIC PANEL
ALT: 8 U/L (ref 0–35)
ANION GAP: 12 (ref 5–15)
AST: 11 U/L (ref 0–37)
Albumin: 3.8 g/dL (ref 3.5–5.2)
Alkaline Phosphatase: 35 U/L — ABNORMAL LOW (ref 39–117)
BILIRUBIN TOTAL: 0.3 mg/dL (ref 0.3–1.2)
BUN: 11 mg/dL (ref 6–23)
CHLORIDE: 100 meq/L (ref 96–112)
CO2: 26 meq/L (ref 19–32)
CREATININE: 0.89 mg/dL (ref 0.50–1.10)
Calcium: 9.1 mg/dL (ref 8.4–10.5)
GFR calc Af Amer: >90 mL/min (ref >90)
GFR, EST NON AFRICAN AMERICAN: 82 mL/min — AB (ref >90)
Glucose, Bld: 90 mg/dL (ref 70–99)
Potassium: 3.1 mEq/L — ABNORMAL LOW (ref 3.7–5.3)
Sodium: 138 mEq/L (ref 137–147)
Total Protein: 6.8 g/dL (ref 6.0–8.3)

## 2014-10-07 LAB — SURGICAL PCR SCREEN
MRSA, PCR: NEGATIVE
Staphylococcus aureus: POSITIVE — AB

## 2014-10-07 LAB — CBC
HEMATOCRIT: 36.3 % (ref 36.0–46.0)
HEMOGLOBIN: 12.3 g/dL (ref 12.0–15.0)
MCH: 28.8 pg (ref 26.0–34.0)
MCHC: 33.9 g/dL (ref 30.0–36.0)
MCV: 85 fL (ref 78.0–100.0)
Platelets: 322 10*3/uL (ref 150–400)
RBC: 4.27 MIL/uL (ref 3.87–5.11)
RDW: 12.8 % (ref 11.5–15.5)
WBC: 6.2 10*3/uL (ref 4.0–10.5)

## 2014-10-07 LAB — HCG, SERUM, QUALITATIVE: Preg, Serum: NEGATIVE

## 2014-10-07 SURGERY — MICRODISCECTOMY LUMBAR LAMINECTOMY
Anesthesia: General | Laterality: Left

## 2014-10-07 MED ORDER — LACTATED RINGERS IV SOLN
INTRAVENOUS | Status: DC | PRN
  Administered 2014-10-07 (×2): via INTRAVENOUS

## 2014-10-07 MED ORDER — OXYCODONE-ACETAMINOPHEN 5-325 MG PO TABS
1.0000 | ORAL_TABLET | ORAL | Status: DC | PRN
  Administered 2014-10-07: 1 via ORAL
  Filled 2014-10-07: qty 1

## 2014-10-07 MED ORDER — MIDAZOLAM HCL 5 MG/5ML IJ SOLN
INTRAMUSCULAR | Status: DC | PRN
  Administered 2014-10-07: 2 mg via INTRAVENOUS

## 2014-10-07 MED ORDER — DEXMEDETOMIDINE HCL 200 MCG/2ML IV SOLN
40.0000 ug | Freq: Once | INTRAVENOUS | Status: AC
  Administered 2014-10-07: 40 ug via INTRAVENOUS
  Filled 2014-10-07: qty 0.4

## 2014-10-07 MED ORDER — PANTOPRAZOLE SODIUM 20 MG PO TBEC
20.0000 mg | DELAYED_RELEASE_TABLET | Freq: Every day | ORAL | Status: DC
  Administered 2014-10-07 – 2014-10-09 (×3): 20 mg via ORAL
  Filled 2014-10-07 (×3): qty 1

## 2014-10-07 MED ORDER — ONDANSETRON HCL 4 MG PO TABS: 4.0000 mg | ORAL_TABLET | Freq: Four times a day (QID) | ORAL | Status: DC | PRN

## 2014-10-07 MED ORDER — OXYCODONE HCL 5 MG PO TABS
ORAL_TABLET | ORAL | Status: AC
  Administered 2014-10-07: 10 mg via ORAL
  Filled 2014-10-07: qty 2

## 2014-10-07 MED ORDER — LIDOCAINE HCL (CARDIAC) 20 MG/ML IV SOLN
INTRAVENOUS | Status: DC | PRN
  Administered 2014-10-07: 40 mg via INTRAVENOUS

## 2014-10-07 MED ORDER — LACTATED RINGERS IV SOLN
INTRAVENOUS | Status: DC
  Administered 2014-10-07: 14:00:00 via INTRAVENOUS

## 2014-10-07 MED ORDER — PROPOFOL 10 MG/ML IV BOLUS
INTRAVENOUS | Status: DC | PRN
  Administered 2014-10-07: 180 mg via INTRAVENOUS

## 2014-10-07 MED ORDER — PREGABALIN 50 MG PO CAPS
50.0000 mg | ORAL_CAPSULE | Freq: Two times a day (BID) | ORAL | Status: DC
  Administered 2014-10-07 – 2014-10-09 (×4): 50 mg via ORAL
  Filled 2014-10-07 (×4): qty 1

## 2014-10-07 MED ORDER — NEOSTIGMINE METHYLSULFATE 10 MG/10ML IV SOLN
INTRAVENOUS | Status: AC
  Filled 2014-10-07: qty 1

## 2014-10-07 MED ORDER — BUPIVACAINE HCL (PF) 0.25 % IJ SOLN
INTRAMUSCULAR | Status: AC
  Filled 2014-10-07: qty 30

## 2014-10-07 MED ORDER — METOCLOPRAMIDE HCL 5 MG/ML IJ SOLN: 5.0000 mg | Freq: Three times a day (TID) | INTRAMUSCULAR | Status: DC | PRN

## 2014-10-07 MED ORDER — DEXTROSE 5 % IV SOLN: 500.0000 mg | Freq: Four times a day (QID) | INTRAVENOUS | Status: DC | PRN

## 2014-10-07 MED ORDER — OXYCODONE HCL 5 MG PO TABS
5.0000 mg | ORAL_TABLET | ORAL | Status: DC | PRN
  Administered 2014-10-07: 5 mg via ORAL
  Filled 2014-10-07: qty 1

## 2014-10-07 MED ORDER — ZOLPIDEM TARTRATE 5 MG PO TABS
5.0000 mg | ORAL_TABLET | Freq: Every evening | ORAL | Status: DC | PRN
  Administered 2014-10-07 – 2014-10-08 (×2): 5 mg via ORAL
  Filled 2014-10-07 (×2): qty 1

## 2014-10-07 MED ORDER — FENTANYL CITRATE 0.05 MG/ML IJ SOLN
INTRAMUSCULAR | Status: DC | PRN
  Administered 2014-10-07: 100 ug via INTRAVENOUS
  Administered 2014-10-07: 150 ug via INTRAVENOUS
  Administered 2014-10-07: 250 ug via INTRAVENOUS
  Administered 2014-10-07: 150 ug via INTRAVENOUS
  Administered 2014-10-07: 100 ug via INTRAVENOUS

## 2014-10-07 MED ORDER — ONDANSETRON HCL 4 MG/2ML IJ SOLN: 4.0000 mg | Freq: Once | INTRAMUSCULAR | Status: DC | PRN

## 2014-10-07 MED ORDER — FENTANYL CITRATE 0.05 MG/ML IJ SOLN
INTRAMUSCULAR | Status: AC
  Filled 2014-10-07: qty 5

## 2014-10-07 MED ORDER — MIDAZOLAM HCL 2 MG/2ML IJ SOLN
INTRAMUSCULAR | Status: AC
  Filled 2014-10-07: qty 2

## 2014-10-07 MED ORDER — FENTANYL CITRATE 0.05 MG/ML IJ SOLN
25.0000 ug | INTRAMUSCULAR | Status: DC | PRN
  Administered 2014-10-07 (×3): 50 ug via INTRAVENOUS

## 2014-10-07 MED ORDER — MUPIROCIN 2 % EX OINT
TOPICAL_OINTMENT | CUTANEOUS | Status: AC
Start: 2014-10-07 — End: 2014-10-07
  Administered 2014-10-07: 1 via TOPICAL
  Filled 2014-10-07: qty 22

## 2014-10-07 MED ORDER — ALBUTEROL SULFATE (2.5 MG/3ML) 0.083% IN NEBU: 2.5000 mg | INHALATION_SOLUTION | Freq: Three times a day (TID) | RESPIRATORY_TRACT | Status: DC | PRN

## 2014-10-07 MED ORDER — ONDANSETRON HCL 4 MG/2ML IJ SOLN: 4.0000 mg | Freq: Four times a day (QID) | INTRAMUSCULAR | Status: DC | PRN

## 2014-10-07 MED ORDER — METHOCARBAMOL 500 MG PO TABS
500.0000 mg | ORAL_TABLET | Freq: Four times a day (QID) | ORAL | Status: DC | PRN
  Administered 2014-10-07 – 2014-10-09 (×5): 500 mg via ORAL
  Filled 2014-10-07 (×5): qty 1

## 2014-10-07 MED ORDER — DOCUSATE SODIUM 100 MG PO CAPS
100.0000 mg | ORAL_CAPSULE | Freq: Two times a day (BID) | ORAL | Status: DC
  Administered 2014-10-07 – 2014-10-09 (×4): 100 mg via ORAL
  Filled 2014-10-07 (×4): qty 1

## 2014-10-07 MED ORDER — POTASSIUM CHLORIDE IN NACL 20-0.45 MEQ/L-% IV SOLN
INTRAVENOUS | Status: DC
  Administered 2014-10-07: 23:00:00 via INTRAVENOUS
  Filled 2014-10-07 (×6): qty 1000

## 2014-10-07 MED ORDER — ALBUTEROL SULFATE HFA 108 (90 BASE) MCG/ACT IN AERS: 2.0000 | INHALATION_SPRAY | Freq: Three times a day (TID) | RESPIRATORY_TRACT | Status: DC | PRN

## 2014-10-07 MED ORDER — MORPHINE SULFATE 2 MG/ML IJ SOLN
1.0000 mg | INTRAMUSCULAR | Status: DC | PRN
Start: 2014-10-07 — End: 2014-10-08
  Administered 2014-10-07 – 2014-10-08 (×2): 1 mg via INTRAVENOUS
  Filled 2014-10-07 (×2): qty 1

## 2014-10-07 MED ORDER — OXYCODONE HCL 5 MG PO TABS
5.0000 mg | ORAL_TABLET | Freq: Once | ORAL | Status: AC | PRN
  Administered 2014-10-07: 10 mg via ORAL

## 2014-10-07 MED ORDER — CHLORHEXIDINE GLUCONATE 4 % EX LIQD: 60.0000 mL | Freq: Once | CUTANEOUS | Status: DC

## 2014-10-07 MED ORDER — NEOSTIGMINE METHYLSULFATE 10 MG/10ML IV SOLN
INTRAVENOUS | Status: DC | PRN
  Administered 2014-10-07: 3 mg via INTRAVENOUS

## 2014-10-07 MED ORDER — 0.9 % SODIUM CHLORIDE (POUR BTL) OPTIME
TOPICAL | Status: DC | PRN
  Administered 2014-10-07: 1000 mL

## 2014-10-07 MED ORDER — ROCURONIUM BROMIDE 100 MG/10ML IV SOLN
INTRAVENOUS | Status: DC | PRN
  Administered 2014-10-07: 35 mg via INTRAVENOUS

## 2014-10-07 MED ORDER — FENTANYL CITRATE 0.05 MG/ML IJ SOLN
INTRAMUSCULAR | Status: AC
  Administered 2014-10-07: 50 ug via INTRAVENOUS
  Filled 2014-10-07: qty 2

## 2014-10-07 MED ORDER — OXYCODONE HCL 5 MG/5ML PO SOLN: 5.0000 mg | Freq: Once | ORAL | Status: AC | PRN

## 2014-10-07 MED ORDER — PROPOFOL 10 MG/ML IV BOLUS
INTRAVENOUS | Status: AC
  Filled 2014-10-07: qty 20

## 2014-10-07 MED ORDER — ONDANSETRON HCL 4 MG/2ML IJ SOLN
INTRAMUSCULAR | Status: DC | PRN
  Administered 2014-10-07: 4 mg via INTRAVENOUS

## 2014-10-07 MED ORDER — MUPIROCIN 2 % EX OINT
1.0000 "application " | TOPICAL_OINTMENT | Freq: Once | CUTANEOUS | Status: AC
  Administered 2014-10-07: 1 via TOPICAL

## 2014-10-07 MED ORDER — ONDANSETRON HCL 4 MG/2ML IJ SOLN
INTRAMUSCULAR | Status: AC
  Filled 2014-10-07: qty 2

## 2014-10-07 MED ORDER — METOCLOPRAMIDE HCL 10 MG PO TABS: 5.0000 mg | ORAL_TABLET | Freq: Three times a day (TID) | ORAL | Status: DC | PRN

## 2014-10-07 MED ORDER — GLYCOPYRROLATE 0.2 MG/ML IJ SOLN
INTRAMUSCULAR | Status: DC | PRN
  Administered 2014-10-07: 0.4 mg via INTRAVENOUS

## 2014-10-07 MED ORDER — GLYCOPYRROLATE 0.2 MG/ML IJ SOLN
INTRAMUSCULAR | Status: AC
  Filled 2014-10-07: qty 2

## 2014-10-07 MED ORDER — BUPIVACAINE HCL (PF) 0.25 % IJ SOLN
INTRAMUSCULAR | Status: DC | PRN
  Administered 2014-10-07: 10 mL

## 2014-10-07 SURGICAL SUPPLY — 47 items
ADH SKN CLS APL DERMABOND .7 (GAUZE/BANDAGES/DRESSINGS) ×1
BUR ROUND FLUTED 4 SOFT TCH (BURR) IMPLANT
CORDS BIPOLAR (ELECTRODE) ×2 IMPLANT
COVER SURGICAL LIGHT HANDLE (MISCELLANEOUS) ×2 IMPLANT
DERMABOND ADVANCED (GAUZE/BANDAGES/DRESSINGS) ×1
DERMABOND ADVANCED .7 DNX12 (GAUZE/BANDAGES/DRESSINGS) ×1 IMPLANT
DRAPE MICROSCOPE LEICA (MISCELLANEOUS) ×2 IMPLANT
DRAPE PROXIMA HALF (DRAPES) ×4 IMPLANT
DRSG EMULSION OIL 3X3 NADH (GAUZE/BANDAGES/DRESSINGS) ×2 IMPLANT
DRSG MEPILEX BORDER 4X4 (GAUZE/BANDAGES/DRESSINGS) ×2 IMPLANT
DRSG MEPILEX BORDER 4X8 (GAUZE/BANDAGES/DRESSINGS) ×2 IMPLANT
DURAPREP 26ML APPLICATOR (WOUND CARE) ×2 IMPLANT
DURASEAL SPINE SEALANT 3ML (MISCELLANEOUS) IMPLANT
ELECT REM PT RETURN 9FT ADLT (ELECTROSURGICAL) ×2
ELECTRODE REM PT RTRN 9FT ADLT (ELECTROSURGICAL) ×1 IMPLANT
GAUZE SPONGE 4X4 12PLY STRL (GAUZE/BANDAGES/DRESSINGS) ×2 IMPLANT
GLOVE BIOGEL PI IND STRL 7.5 (GLOVE) ×1 IMPLANT
GLOVE BIOGEL PI IND STRL 8 (GLOVE) ×1 IMPLANT
GLOVE BIOGEL PI INDICATOR 7.5 (GLOVE) ×1
GLOVE BIOGEL PI INDICATOR 8 (GLOVE) ×1
GLOVE ECLIPSE 7.0 STRL STRAW (GLOVE) ×2 IMPLANT
GLOVE ORTHO TXT STRL SZ7.5 (GLOVE) ×2 IMPLANT
GOWN STRL REUS W/ TWL LRG LVL3 (GOWN DISPOSABLE) ×3 IMPLANT
GOWN STRL REUS W/TWL LRG LVL3 (GOWN DISPOSABLE) ×6
KIT BASIN OR (CUSTOM PROCEDURE TRAY) ×2 IMPLANT
KIT ROOM TURNOVER OR (KITS) ×2 IMPLANT
MANIFOLD NEPTUNE II (INSTRUMENTS) ×2 IMPLANT
NDL SPNL 18GX3.5 QUINCKE PK (NEEDLE) ×1 IMPLANT
NDL SUT .5 MAYO 1.404X.05X (NEEDLE) ×1 IMPLANT
NEEDLE 22X1 1/2 (OR ONLY) (NEEDLE) ×2 IMPLANT
NEEDLE MAYO TAPER (NEEDLE) ×2
NEEDLE SPNL 18GX3.5 QUINCKE PK (NEEDLE) ×2 IMPLANT
NS IRRIG 1000ML POUR BTL (IV SOLUTION) ×2 IMPLANT
PACK LAMINECTOMY ORTHO (CUSTOM PROCEDURE TRAY) ×2 IMPLANT
PAD ARMBOARD 7.5X6 YLW CONV (MISCELLANEOUS) ×4 IMPLANT
PATTIES SURGICAL .5 X.5 (GAUZE/BANDAGES/DRESSINGS) ×2 IMPLANT
PATTIES SURGICAL .75X.75 (GAUZE/BANDAGES/DRESSINGS) IMPLANT
SUT VIC AB 2-0 CT1 27 (SUTURE) ×2
SUT VIC AB 2-0 CT1 TAPERPNT 27 (SUTURE) ×1 IMPLANT
SUT VICRYL 0 TIES 12 18 (SUTURE) ×2 IMPLANT
SUT VICRYL 4-0 PS2 18IN ABS (SUTURE) IMPLANT
SUT VICRYL AB 2 0 TIES (SUTURE) ×2 IMPLANT
SYR 20ML ECCENTRIC (SYRINGE) IMPLANT
SYR CONTROL 10ML LL (SYRINGE) ×2 IMPLANT
TOWEL OR 17X24 6PK STRL BLUE (TOWEL DISPOSABLE) ×2 IMPLANT
TOWEL OR 17X26 10 PK STRL BLUE (TOWEL DISPOSABLE) ×2 IMPLANT
WATER STERILE IRR 1000ML POUR (IV SOLUTION) ×2 IMPLANT

## 2014-10-07 NOTE — Transfer of Care (Signed)
Immediate Anesthesia Transfer of Care Note  Patient: Victoria GlasgowBarbara J Ellis  Procedure(s) Performed: Procedure(s): L5-S1 Microdiscectomy, Removal Free Fragment (Left)  Patient Location: PACU  Anesthesia Type:General  Level of Consciousness: awake, oriented and patient cooperative  Airway & Oxygen Therapy: Patient Spontanous Breathing and Patient connected to nasal cannula oxygen  Post-op Assessment: Report given to PACU RN, Post -op Vital signs reviewed and stable and Patient moving all extremities  Post vital signs: Reviewed and stable  Complications: No apparent anesthesia complications

## 2014-10-07 NOTE — OR Nursing (Signed)
Ms. Melvyn NethLewis arrived to pacu from OR on a bed. She was in severe pain.  Nadine CountsBob CRNA gave additional fentanyl however pt was still very uncomfortable.  Dr. Noreene LarssonJoslin at bedside.  Dr. Noreene LarssonJoslin gave 40 mcg IV Precedex at bedside. Pt. States that her pain.

## 2014-10-07 NOTE — OR Nursing (Signed)
Victoria Ellis was delivered to 5N30 in a bed.  Oxygen hooked up.  Pt. alert, oriented x4 on [place] at 2L.  Pt has two bags of belongings brought with us from the pacu.  Pt. Asked to keep one of her bags on the bed.  I asked her if she had any home medications in the bag and she responded no.  I informed her I was only asking for her safety because we need to know what she is taking while in the hospital and I also stated she was not to be taking any home meds without the physician's order.  She stated that she was only using candy out of her bag to treat her dry mouth.  I did not search her belongings for any meds/substances.  Report was given to [facility] RN.

## 2014-10-07 NOTE — Anesthesia Procedure Notes (Signed)
Procedure Name: Intubation Date/Time: 10/07/2014 3:59 PM Performed by: Coralee RudFLORES, Peggy Loge Pre-anesthesia Checklist: Patient identified, Emergency Drugs available, Suction available and Patient being monitored Patient Re-evaluated:Patient Re-evaluated prior to inductionOxygen Delivery Method: Circle system utilized Preoxygenation: Pre-oxygenation with 100% oxygen Intubation Type: IV induction Ventilation: Mask ventilation without difficulty Laryngoscope Size: Miller and 3 Grade View: Grade I Tube type: Oral Tube size: 7.0 mm Number of attempts: 1 Airway Equipment and Method: Stylet Placement Confirmation: ETT inserted through vocal cords under direct vision,  positive ETCO2 and breath sounds checked- equal and bilateral Secured at: 20 cm Tube secured with: Tape Dental Injury: Teeth and Oropharynx as per pre-operative assessment

## 2014-10-07 NOTE — Anesthesia Postprocedure Evaluation (Signed)
  Anesthesia Post-op Note  Patient: Victoria Ellis  Procedure(s) Performed: Procedure(s): L5-S1 Microdiscectomy, Removal Free Fragment (Left)  Patient Location: PACU  Anesthesia Type:General  Level of Consciousness: awake, alert  and oriented  Airway and Oxygen Therapy: Patient Spontanous Breathing and Patient connected to nasal cannula oxygen  Post-op Pain: mild  Post-op Assessment: Post-op Vital signs reviewed, Patient's Cardiovascular Status Stable, Respiratory Function Stable, Patent Airway and No signs of Nausea or vomiting  Post-op Vital Signs: stable  Last Vitals:  Filed Vitals:   10/07/14 1735  BP: 148/94  Pulse: 65  Temp: 36.8 C  Resp: 16    Complications: No apparent anesthesia complications

## 2014-10-07 NOTE — Anesthesia Preprocedure Evaluation (Signed)
Anesthesia Evaluation  Patient identified by MRN, date of birth, ID band Patient awake    Reviewed: Allergy & Precautions, H&P , NPO status , Patient's Chart, lab work & pertinent test results  Airway Mallampati: II TM Distance: >3 FB Neck ROM: Full    Dental  (+) Teeth Intact, Dental Advisory Given   Pulmonary  breath sounds clear to auscultation        Cardiovascular Rhythm:Regular Rate:Normal     Neuro/Psych    GI/Hepatic   Endo/Other    Renal/GU      Musculoskeletal   Abdominal   Peds  Hematology   Anesthesia Other Findings   Reproductive/Obstetrics                           Anesthesia Physical Anesthesia Plan  ASA: II  Anesthesia Plan: General   Post-op Pain Management:    Induction: Intravenous  Airway Management Planned: Oral ETT  Additional Equipment:   Intra-op Plan:   Post-operative Plan: Extubation in OR  Informed Consent: I have reviewed the patients History and Physical, chart, labs and discussed the procedure including the risks, benefits and alternatives for the proposed anesthesia with the patient or authorized representative who has indicated his/her understanding and acceptance.   Dental advisory given  Plan Discussed with: CRNA and Anesthesiologist  Anesthesia Plan Comments:         Anesthesia Quick Evaluation  

## 2014-10-07 NOTE — Brief Op Note (Signed)
10/07/2014  5:27 PM  PATIENT:  Victoria GlasgowBarbara J Ellis  38 y.o. female  PRE-OPERATIVE DIAGNOSIS:  Large Left L5-S1 HNP  POST-OPERATIVE DIAGNOSIS:  Large Left L5-S1 HNP  PROCEDURE:  Procedure(s): L5-S1 Microdiscectomy, Removal Free Fragment (Left)  SURGEON:  Surgeon(s) and Role:    * Eldred MangesMark C Yates, MD - Primary  PHYSICIAN ASSISTANT: Cayleb Jarnigan m. Barry Dienesowens    ANESTHESIA:   general  EBL:  Total I/O In: 1000 [I.V.:1000] Out: -   BLOOD ADMINISTERED:none  LOCAL MEDICATIONS USED:  MARCAINE     SPECIMEN:  No Specimen  DISPOSITION OF SPECIMEN:  N/A  COUNTS:  YES  TOURNIQUET:  * No tourniquets in log *  PATIENT DISPOSITION:  PACU - hemodynamically stable.

## 2014-10-07 NOTE — Interval H&P Note (Signed)
History and Physical Interval Note:  10/07/2014 3:10 PM  Victoria Ellis  has presented today for surgery, with the diagnosis of Large Left L5-S1 HNP  The various methods of treatment have been discussed with the patient and family. After consideration of risks, benefits and other options for treatment, the patient has consented to  Procedure(s): L5-S1 Microdiscectomy, Removal Free Fragment (Left) as a surgical intervention .  The patient's history has been reviewed, patient examined, no change in status, stable for surgery.  I have reviewed the patient's chart and labs.  Questions were answered to the patient's satisfaction.     Mata Rowen C

## 2014-10-08 DIAGNOSIS — M5116 Intervertebral disc disorders with radiculopathy, lumbar region: Secondary | ICD-10-CM | POA: Diagnosis not present

## 2014-10-08 LAB — BASIC METABOLIC PANEL
ANION GAP: 10 (ref 5–15)
BUN: 9 mg/dL (ref 6–23)
CALCIUM: 8.5 mg/dL (ref 8.4–10.5)
CO2: 24 meq/L (ref 19–32)
CREATININE: 0.89 mg/dL (ref 0.50–1.10)
Chloride: 106 mEq/L (ref 96–112)
GFR, EST NON AFRICAN AMERICAN: 82 mL/min — AB (ref >90)
Glucose, Bld: 95 mg/dL (ref 70–99)
Potassium: 4.4 mEq/L (ref 3.7–5.3)
SODIUM: 140 meq/L (ref 137–147)

## 2014-10-08 MED ORDER — OXYCODONE HCL 5 MG PO TABS
10.0000 mg | ORAL_TABLET | ORAL | Status: DC | PRN
  Administered 2014-10-08 – 2014-10-09 (×11): 10 mg via ORAL
  Filled 2014-10-08 (×12): qty 2

## 2014-10-08 MED ORDER — HYDROMORPHONE HCL 1 MG/ML IJ SOLN
1.0000 mg | INTRAMUSCULAR | Status: DC | PRN
  Administered 2014-10-08: 1 mg via INTRAVENOUS
  Filled 2014-10-08: qty 1

## 2014-10-08 NOTE — Op Note (Signed)
[place]MA[place]:  Ellis, Victoria               ACCOUNT NO.:  [id number]  MEDICAL RECORD NO.:  [id number]  LOCATION:  5N30C                        FACILITY:  [facility]  PHYSICIAN:  Leonard Hendler C. Ophelia Ellis, M.D.    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  PROCEDURE:  Left L5-S1 microdiskectomy for recurrent herniated nucleus pulposus with removal of free fragment.  SURGEON:  Annell GreeningMark Pawan Knechtel, MD  ASSISTANT:  Genene ChurnJames M. Barry Dieneswens, PA-C medically necessary and present for the entire procedure  ANESTHESIA:  GOT plus Marcaine skin local.  ESTIMATED BLOOD LOSS:  Minimal.  COMPLICATIONS:  None.  FINDINGS:  Large HNP free fragment.  DESCRIPTION OF PROCEDURE:  After induction of general anesthesia, standard prepping and draping, sterile skin marker was used before Betadine and Steri-Drape application and cross lines were made.  The patient had a tattoo that was directly over the old incision, which had had subcuticular closure.  Time-out procedure was completed.  Old incision was opened and extended slightly caudally.  Subperiosteal dissection down onto the lamina was performed.  The patient had some increased lumbar  lordosis even in the prone position and with the patient's solid and adequate blood pressure, head was elevated in reverse Trendelenburg.  Penfield 4 was placed at the laminotomy defect on the left at L5, cross-table lateral confirmed appropriate level. Microscope was draped, brought in, and chunks of ligament were removed from the lateral gutter.  Nerve root was very tight but had minimal mobility and a large fragment was present which was obtained by initially going into the disk space, removing  several large fragments, then gradually reaching under teasing with the black nerve hook and ball- tip nerve hook.  Pieces of disk until a large free fragment was grasped and removed.  Some remaining fragments were removed which were adherent to the ventral surface of the dura.  There continued to be gradually teased down until there was good decompression.  Nerve root was free. Hockey stick could be passed anterior to the dura 180 degrees with no areas of compression.  Undersurface of the dura was palpated and all fragments were removed.  The sac for the free fragment was well visualized, and there was good decompression.  Shoulder of the nerve root was free, bone was removed up the pedicle, axilla of the nerve root showed there were no areas of compression, palpation proximal and distal showed no areas of compression.  Irrigation with saline solution. Taylor retractor was removed which had been held 5 to pounds weight and standard closure with #1 Vicryl in the deep fascia, 2-0 Vicryl with 3-0 Vicryl in subcutaneous tissue, 4-0 Vicryl subcuticular closure.  Dermabond, Marcaine infiltration, and postop dressing.  Instrument count and needle count was correct.     Victoria Ellis, M.D.     MCY/MEDQ  D:  10/07/2014  T:  10/08/2014  Job:  [id number]

## 2014-10-08 NOTE — Progress Notes (Signed)
Spoke with patient about discharging today 12/17 and per patient does not feel like she would be safe to go home in the state that she is in and wants to be discharged tomorrow 12/18. Pt is in a lot of pain when ambulating. MD is okay with her staying one more night and D/C in the am.

## 2014-10-08 NOTE — Evaluation (Signed)
Physical Therapy Evaluation Patient Details Name: Victoria Ellis MRN: [id number] DOB: Mar 06, 1976 Today's Date: 10/08/2014   History of Present Illness  Pt is a 38 y.o. female s/p L5-S1 Microdiscectomy.   Clinical Impression  Patient is s/p above surgery resulting in the deficits listed below (see PT Problem List).  Patient will benefit from skilled PT to increase their independence and safety with mobility (while adhering to their precautions) to allow discharge to the venue listed below. Pt in 10/10 pain and crying throughout session. Pt denied any radiating pain down LEs. Pt does not feel safe to go home and is not mobilizing safe enough to safely D/C home today.      Follow Up Recommendations No PT follow up;Supervision/Assistance - 24 hour    Equipment Recommendations  Rolling walker with 5" wheels    Recommendations for Other Services OT consult     Precautions / Restrictions Precautions Precautions: Back;Fall Precaution Booklet Issued: Yes (comment) Precaution Comments: reviewed back precautions "BAT" and handout; pt reports multiple falls and hx of Lt foot drop Restrictions Weight Bearing Restrictions: No      Mobility  Bed Mobility Overal bed mobility: Needs Assistance Bed Mobility: Rolling;Sidelying to Sit Rolling: Min assist Sidelying to sit: Min assist       General bed mobility comments: cues for log rolling technique; (A) to power up; pt required incr time due to pain  Transfers Overall transfer level: Needs assistance Equipment used: Rolling walker (2 wheeled) Transfers: Sit to/from Stand Sit to Stand: Min guard         General transfer comment: cues for hand placement and safety with RW   Ambulation/Gait Ambulation/Gait assistance: Min guard Ambulation Distance (Feet): 18 Feet Assistive device: Rolling walker (2 wheeled) Gait Pattern/deviations: Step-through pattern;Decreased stride length;Shuffle;Narrow base of support Gait velocity:  decreased Gait velocity interpretation: Below normal speed for age/gender General Gait Details: pt guarded and limited due to pain; cues for sequencing and safety with RW; no noted Lt foot drop during ambulation this session  Stairs            Wheelchair Mobility    Modified Rankin (Stroke Patients Only)       Balance Overall balance assessment: History of Falls;Needs assistance Sitting-balance support: Feet supported;Single extremity supported Sitting balance-Leahy Scale: Poor Sitting balance - Comments: guarded; UE always supporting upright position    Standing balance support: During functional activity;Bilateral upper extremity supported Standing balance-Leahy Scale: Poor Standing balance comment: RW at all times to balance                              Pertinent Vitals/Pain Pain Assessment: 0-10 Pain Score: 10-Worst pain ever Pain Location: surgical site Pain Descriptors / Indicators: Aching;Sharp Pain Intervention(s): Monitored during session;Premedicated before session;Limited activity within patient's tolerance;Repositioned    Home Living Family/patient expects to be discharged to:: Private residence Living Arrangements: Children Available Help at Discharge: Family;Available 24 hours/day Type of Home: House Home Access: Level entry     Home Layout: One level Home Equipment: Cane - quad Additional Comments: children are 17,15 and13    Prior Function Level of Independence: Independent with assistive device(s)         Comments[facility]     Hand Dominance        Extremity/Trunk Assessment   Upper Extremity Assessment: Defer to OT evaluation           Lower Extremity Assessment: Generalized  weakness (hx of Lt foot drop)      Cervical / Trunk Assessment: Normal  Communication   Communication: No difficulties  Cognition Arousal/Alertness: Awake/alert Behavior During Therapy: Anxious Overall Cognitive Status:  Within Functional Limits for tasks assessed       Memory: Decreased recall of precautions              General Comments      Exercises Low Level/ICU Exercises Ankle Circles/Pumps: AROM;Both;10 reps;Supine      Assessment/Plan    PT Assessment Patient needs continued PT services  PT Diagnosis Difficulty walking;Generalized weakness;Acute pain   PT Problem List Decreased strength;Decreased activity tolerance;Decreased balance;Decreased knowledge of precautions;Decreased knowledge of use of DME;Decreased mobility;Pain  PT Treatment Interventions DME instruction;Gait training;Functional mobility training;Therapeutic activities;Therapeutic exercise;Balance training;Neuromuscular re-education;Patient/family education   PT Goals (Current goals can be found in the Care Plan section) Acute Rehab PT Goals Patient Stated Goal: to go home when my pain is better PT Goal Formulation: With patient Time For Goal Achievement: 10/11/14 Potential to Achieve Goals: Good    Frequency Min 5X/week   Barriers to discharge        Co-evaluation               End of Session Equipment Utilized During Treatment: Gait belt Activity Tolerance: Patient limited by pain Patient left: in chair;with call bell/phone within reach Nurse Communication: Mobility status;Precautions    Functional Assessment Tool Used: clinical judgement Functional Limitation: Mobility: Walking and moving around Mobility: Walking and Moving Around Current Status [phone]): At least 1 percent but less than 20 percent impaired, limited or restricted Mobility: Walking and Moving Around Goal Status [phone](G8979): At least 1 percent but less than 20 percent impaired, limited or restricted    Time: 1131-1147 PT Time Calculation (min) (ACUTE ONLY): 16 min   Charges:   PT Evaluation $Initial PT Evaluation Tier I: 1 Procedure PT Treatments $Gait Training: 8-22 mins   PT G Codes:   Functional Assessment Tool Used: clinical  judgement Functional Limitation: Mobility: Walking and moving around    [place], [place]any Ellis, Victoria Ellis

## 2014-10-08 NOTE — Progress Notes (Signed)
Pts pain was uncontrolled with 1 mg IV morphine, 5 mg Oxycodone IR, and 5 mg Percocet. Pt rated pain 10/10 after receiving all available pain medications, was visibly shaking, and has very limited movement d/t pain. Tried repositioning and environmental control. Paged on call for Dr. Ophelia CharterYates and received new orders from Dr. Lajoyce Cornersuda to add 1 mg Dilaudid and increase dosage of Oxy IR to 10 mg. Percocet was d/c.   Victoria Ellis, Victoria Ellis 10/08/2014

## 2014-10-08 NOTE — Plan of Care (Signed)
Problem: Consults Goal: Diagnosis - Spinal Surgery Microdiscectomy L5-S1     

## 2014-10-08 NOTE — Discharge Instructions (Signed)
Ambulate daily. OK to shower then reapply dry dressing. Avoid sitting . OK for walking and lying down.  OK for recliner position. Use pain medication as you normally take it.  Follow up with dr. Ophelia CharterYates in 1 to 2 wks. Call [phone] for Appointment

## 2014-10-08 NOTE — Progress Notes (Addendum)
Subjective: 1 Day Post-Op Procedure(s) (LRB): L5-S1 Microdiscectomy, Removal Free Fragment (Left) Patient reports pain as severe.   She was asleep when I came into room.  Has not been OOB yet.  Objective: Vital signs in last 24 hours: Temp:  [97.7 F (36.5 C)-99.4 F (37.4 C)] 98.9 F (37.2 C) (12/17 0414) Pulse Rate:  [52-83] 83 (12/17 0414) Resp:  [10-20] 20 (12/17 0414) BP: (80-148)/(41-94) 107/54 mmHg (12/17 0414) SpO2:  [98 %-100 %] 100 % (12/17 0414) Weight:  [52.164 kg (115 lb)-55.7 kg (122 lb 12.7 oz)] 55.7 kg (122 lb 12.7 oz) (12/16 2000)  Intake/Output from previous day: 12/16 0701 - 12/17 0700 In: 2560 [P.O.:480; I.V.:2080] Out: 500 [Urine:500] Intake/Output this shift:     Recent Labs  10/07/14 1334  HGB 12.3    Recent Labs  10/07/14 1334  WBC 6.2  RBC 4.27  HCT 36.3  PLT 322    Recent Labs  10/07/14 1334 10/08/14 0539  NA 138 140  K 3.1* 4.4  CL 100 106  CO2 26 24  BUN 11 9  CREATININE 0.89 0.89  GLUCOSE 90 95  CALCIUM 9.1 8.5   No results for input(s): LABPT, INR in the last 72 hours.  Neurologically intact still with same foot drop as pre-op present times 4 months.  Good relief of leg pain.   Assessment/Plan: 1 Day Post-Op Procedure(s) (LRB): L5-S1 Microdiscectomy, Removal Free Fragment (Left) Up with therapy  Home today. Has been on oxycodone 10mg  QID for more than 8 years.  Once mobile home later today.   Mirenda Baltazar C 10/08/2014, 7:46 AM

## 2014-10-08 NOTE — Progress Notes (Signed)
Patient ID: Victoria GlasgowBarbara J Ellis, female   DOB: [birth year], 38 y.o.   MRN: [id number] Talked with pt by phone has been OOB. Will order 3 in 1 for home use. Discharge this afternoon. rx on chart and she has chronic pain meds at home. ROV one week to 2 wks

## 2014-10-08 NOTE — Progress Notes (Signed)
UR completed 

## 2014-10-09 ENCOUNTER — Encounter (HOSPITAL_COMMUNITY): Payer: Self-pay | Admitting: Orthopaedic Surgery

## 2014-10-09 DIAGNOSIS — M5116 Intervertebral disc disorders with radiculopathy, lumbar region: Secondary | ICD-10-CM | POA: Diagnosis not present

## 2014-10-09 NOTE — Progress Notes (Signed)
Subjective: 2 Days Post-Op Procedure(s) (LRB): L5-S1 Microdiscectomy, Removal Free Fragment (Left) Patient reports pain as moderate and severe.  Long term Rx narcotic use. Poor tolerance for pain.   Objective: Vital signs in last 24 hours: Temp:  [99.4 F (37.4 C)-100.2 F (37.9 C)] 100.2 F (37.9 C) (12/18 0537) Pulse Rate:  [78-93] 92 (12/18 0537) Resp:  [18-20] 18 (12/18 0537) BP: (84-117)/(51-66) 107/51 mmHg (12/18 0537) SpO2:  [98 %-100 %] 100 % (12/18 0537)  Intake/Output from previous day: 12/17 0701 - 12/18 0700 In: 1200 [P.O.:1200] Out: -  Intake/Output this shift: Total I/O In: 240 [P.O.:240] Out: 300 [Urine:300]   Recent Labs  10/07/14 1334  HGB 12.3    Recent Labs  10/07/14 1334  WBC 6.2  RBC 4.27  HCT 36.3  PLT 322    Recent Labs  10/07/14 1334 10/08/14 0539  NA 138 140  K 3.1* 4.4  CL 100 106  CO2 26 24  BUN 11 9  CREATININE 0.89 0.89  GLUCOSE 90 95  CALCIUM 9.1 8.5   No results for input(s): LABPT, INR in the last 72 hours.  still with left foot drop same as pre-op present for 4 months.   Assessment/Plan: 2 Days Post-Op Procedure(s) (LRB): L5-S1 Microdiscectomy, Removal Free Fragment (Left) Plan:  Discharge Home. Office one week  Stoney Karczewski C 10/09/2014, 10:55 AM

## 2014-10-09 NOTE — Progress Notes (Signed)
Physical Therapy Treatment Patient Details Name: Victoria GlasgowBarbara J Ellis MRN: [id number] DOB: 01-25-1976 Today's Date: 10/09/2014    History of Present Illness Pt is a 38 y.o. female s/p L5-S1 Microdiscectomy.     PT Comments    Pt mobilizing within room but continues to be guarded and limited by pain. Pt hopes to D/C home today. Will benefit from OT evaluation to address ADLs with back precautions. Recommend use of RW at all times to reduce risk of falls, pt verbalized understanding.   Follow Up Recommendations  No PT follow up;Supervision/Assistance - 24 hour     Equipment Recommendations  Rolling walker with 5" wheels    Recommendations for Other Services OT consult     Precautions / Restrictions Precautions Precautions: Back;Fall Precaution Comments: pt able to recall 2/3 back precautions independently; reviewed precautions throughout session with pt Restrictions Weight Bearing Restrictions: No    Mobility  Bed Mobility Overal bed mobility: Needs Assistance Bed Mobility: Rolling;Sidelying to Sit Rolling: Supervision Sidelying to sit: Min assist       General bed mobility comments: reviewed log rolling technique with pt; pt requiring incr time to elevate trunk; min (A) required due to pain and weakness   Transfers Overall transfer level: Needs assistance Equipment used: Rolling walker (2 wheeled) Transfers: Sit to/from Stand Sit to Stand: Supervision         General transfer comment: cues for hand placement and safety with RW  Ambulation/Gait Ambulation/Gait assistance: Supervision Ambulation Distance (Feet): 30 Feet Assistive device: Rolling walker (2 wheeled) Gait Pattern/deviations: Step-through pattern;Narrow base of support Gait velocity: decreased Gait velocity interpretation: Below normal speed for age/gender General Gait Details: pt limited due to pain; cues for safety with RW; no over LOB noted ambulating in room   Stairs            Wheelchair  Mobility    Modified Rankin (Stroke Patients Only)       Balance Overall balance assessment: Needs assistance;History of Falls Sitting-balance support: Feet supported;No upper extremity supported Sitting balance-Leahy Scale: Fair Sitting balance - Comments: guarded sitting EOB but able to sit without UE support   Standing balance support: During functional activity;Bilateral upper extremity supported Standing balance-Leahy Scale: Poor Standing balance comment: RW to balance                     Cognition Arousal/Alertness: Awake/alert Behavior During Therapy: Anxious Overall Cognitive Status: Within Functional Limits for tasks assessed       Memory: Decreased recall of precautions              Exercises      General Comments General comments (skin integrity, edema, etc.): discussed need for OT consult with PA      Pertinent Vitals/Pain Pain Assessment: 0-10 Pain Score: 10-Worst pain ever Pain Location: surgical site Pain Descriptors / Indicators: Spasm;Sharp Pain Intervention(s): Limited activity within patient's tolerance;Monitored during session;Premedicated before session;Repositioned;Heat applied    Home Living                      Prior Function            PT Goals (current goals can now be found in the care plan section) Acute Rehab PT Goals Patient Stated Goal: home today PT Goal Formulation: With patient Time For Goal Achievement: 10/11/14 Potential to Achieve Goals: Good Progress towards PT goals: Progressing toward goals    Frequency  Min 5X/week    PT Plan Current plan  remains appropriate    Co-evaluation             End of Session Equipment Utilized During Treatment: Gait belt Activity Tolerance: Patient limited by pain Patient left: in chair;with call bell/phone within reach     Time: 1106-1120 PT Time Calculation (min) (ACUTE ONLY): 14 min  Charges:  $Gait Training: 8-22 mins                    G Codes:   Functional Assessment Tool Used: clinical judgement Functional Limitation: Mobility: Walking and moving around Mobility: Walking and Moving Around Current Status [phone](G8978): At least 1 percent but less than 20 percent impaired, limited or restricted Mobility: Walking and Moving Around Goal Status [phone](G8979): At least 1 percent but less than 20 percent impaired, limited or restricted Mobility: Walking and Moving Around Discharge Status [phone](G8980): At least 1 percent but less than 20 percent impaired, limited or restricted   Victoria SievertWest, Victoria Ellis, [place]PT  [phone] 10/09/2014, 1:53 PM

## 2014-10-09 NOTE — Evaluation (Signed)
Occupational Therapy Evaluation and Discharge Patient Details Name: Victoria GlasgowBarbara J Ellis MRN: [id number] DOB: 1976-07-24 Today's Date: 10/09/2014    History of Present Illness Pt is a 38 y.o. female s/p L5-S1 Microdiscectomy.    Clinical Impression   This 38 yo female admitted and underwent above presents to acute OT with all education completed, we will D/C from acute OT.    Follow Up Recommendations  No OT follow up    Equipment Recommendations  3 in 1 bedside comode (RW)       Precautions / Restrictions Precautions Precautions: Back;Fall Precaution Comments: pt able to recall 2/3 back precautions independently; reviewed precautions throughout session with pt Restrictions Weight Bearing Restrictions: No      Mobility Bed Mobility       General bed mobility comments: Pt up in recliner upon arrival, reclined and sacral sitting. Reminded pt that after having back surgery it is better to sit upright and not recline  Transfers Overall transfer level: Needs assistance Equipment used: Rolling walker (2 wheeled) Transfers: Sit to/from Stand Sit to Stand: Min guard         General transfer comment: cues for hand placement and safety with RW         ADL Overall ADL's : Needs assistance/impaired Eating/Feeding: Independent;Sitting   Grooming: Set up;Sitting   Upper Body Bathing: Sitting;Supervision/ safety;Set up   Lower Body Bathing: Sit to/from stand (Pt says she is able to cross her legs to bath her LB while seated)   Upper Body Dressing : Sitting (Pt reports she dressed herself)   Lower Body Dressing: Sit to/from stand Lower Body Dressing Details (indicate cue type and reason): pt reports she dressed herself by crossing one leg over the other, that she did not bedn Toilet Transfer: Min guard;Ambulation;RW;BSC   Toileting- ArchitectClothing Manipulation and Hygiene: Min guard;Sit to/from stand Toileting - Clothing Manipulation Details (indicate cue type and reason):  Recommended to pt that she use baby wipes for back peri-care for ease of cleaning and back precautions Tub/ Shower Transfer: Tub transfer;Adhering to back precautions;Ambulation;Rolling walker;3 in 1     General ADL Comments: Issued pt a reacher to A with BADLs prn and IADLs prn               Pertinent Vitals/Pain Pain Assessment: 0-10 (1350) Pain Score: 10-Worst pain ever Pain Location: back Pain Descriptors / Indicators: Spasm;Stabbing;Sharp Pain Intervention(s): Patient requesting pain meds-RN notified (muscle relaxer)     Hand Dominance Right   Extremity/Trunk Assessment Upper Extremity Assessment Upper Extremity Assessment: Overall WFL for tasks assessed           Communication Communication Communication: No difficulties   Cognition Arousal/Alertness: Awake/alert Behavior During Therapy: WFL for tasks assessed/performed Overall Cognitive Status: Within Functional Limits for tasks assessed       Memory: Decreased recall of precautions                        Home Living Family/patient expects to be discharged to:: Private residence Living Arrangements: Children Available Help at Discharge: Family;Available 24 hours/day Type of Home: House Home Access: Level entry     Home Layout: One level     Bathroom Shower/Tub: Tub/shower unit;Curtain Shower/tub characteristics: Engineer, building servicesCurtain Bathroom Toilet: Standard     Home Equipment: Cane - quad;Hand held shower head   Additional Comments: children are 17,15 and13      Prior Functioning/Environment Level of Independence: Independent with assistive device(s)  [facility]    OT Diagnosis: Generalized weakness;Acute pain         OT Goals(Current goals can be found in the care plan section) Acute Rehab OT Goals Patient Stated Goal: home today  OT Frequency:                End of Session Equipment Utilized During Treatment: Rolling walker Nurse Communication:  (Pt  ready to go once RW gets here)  Activity Tolerance: Patient limited by pain Patient left: in chair;with call bell/phone within reach;with family/visitor present   Time: 1349-1423 OT Time Calculation (min): 34 min Charges:  OT General Charges $OT Visit: 1 Procedure OT Evaluation $Initial OT Evaluation Tier I: 1 Procedure OT Treatments $Self Care/Home Management : 23-37 mins G-Codes: OT G-codes **NOT FOR INPATIENT CLASS** Functional Assessment Tool Used: Clinical observation Functional Limitation: Self care Self Care Current Status (W2956(G8987): At least 1 percent but less than 20 percent impaired, limited or restricted Self Care Goal Status (O1308(G8988): At least 1 percent but less than 20 percent impaired, limited or restricted Self Care Discharge Status [phone]([id number]): At least 1 percent but less than 20 percent impaired, limited or restricted  Victoria GeorgesLeonard, Victoria Ellis Eva [phone] 10/09/2014, 4:07 PM

## 2014-10-26 ENCOUNTER — Other Ambulatory Visit: Payer: Self-pay | Admitting: *Deleted

## 2014-10-26 DIAGNOSIS — M549 Dorsalgia, unspecified: Secondary | ICD-10-CM

## 2014-10-28 MED ORDER — OXYCODONE HCL 10 MG PO TABS: 10.0000 mg | ORAL_TABLET | Freq: Four times a day (QID) | ORAL | Status: DC | PRN

## 2014-10-28 NOTE — Telephone Encounter (Signed)
Left pt message for rtc

## 2014-10-28 NOTE — Telephone Encounter (Signed)
Pt.notified

## 2014-11-16 NOTE — Discharge Summary (Signed)
Patient ID: Victoria Ellis Gebhardt MRN: [id number] DOB/AGE: May 30, 1976 39 y.o.  Admit date: 10/07/2014 Discharge date: 11/16/2014  Admission Diagnoses:  Active Problems:   S/P lumbar discectomy   Discharge Diagnoses:  Active Problems:   S/P lumbar discectomy  status post Procedure(s): L5-S1 Microdiscectomy, Removal Free Fragment  Past Medical History  Diagnosis Date  . Chronic back pain 2000      1.  Postoperative changes left hemilaminotomy L5-S1.  Marland Kitchen. Foot drop     left foot  . Abnormal Pap smear   . Nerve damage     bilateral legs   . Complication of anesthesia     11/15 while having tooth extraction woke up.  . Seizures     10/06/14- over 6 months ago- "maybe antidepressant- Zoloft.  . Asthma   . Anxiety   . Depression   . GERD (gastroesophageal reflux disease)   . Constipation     Surgeries: Procedure(s): L5-S1 Microdiscectomy, Removal Free Fragment on 10/07/2014   Consultants:    Discharged Condition: Improved  Hospital Course: Victoria Ellis Paiz is an 39 y.o. female who was admitted 10/07/2014 for operative treatment of lumbar HNP. Patient failed conservative treatments (please see the history and physical for the specifics) and had severe unremitting pain that affects sleep, daily activities and work/hobbies. After pre-op clearance, the patient was taken to the operating room on 10/07/2014 and underwent  Procedure(s): L5-S1 Microdiscectomy, Removal Free Fragment.    Patient was given perioperative antibiotics:  Anti-infectives    Start     Dose/Rate Route Frequency Ordered Stop   10/07/14 0600  ceFAZolin (ANCEF) IVPB 2 g/50 mL premix     2 g100 mL/hr over 30 Minutes Intravenous On call to O.R. 10/06/14 1354 10/07/14 1555       Patient was given sequential compression devices and early ambulation to prevent DVT.   Patient benefited maximally from hospital stay and there were no complications. At the time of discharge, the patient was urinating/moving their  bowels without difficulty, tolerating a regular diet, pain is controlled with oral pain medications and they have been cleared by PT/OT.   Recent vital signs: No data found.    Recent laboratory studies: No results for input(s): WBC, HGB, HCT, PLT, NA, K, CL, CO2, BUN, CREATININE, GLUCOSE, INR, CALCIUM in the last 72 hours.  Invalid input(s): PT, 2   Discharge Medications:     Medication List    STOP taking these medications        clindamycin 300 MG capsule  Commonly known as:  CLEOCIN     Oxycodone HCl 10 MG Tabs      TAKE these medications        albuterol 108 (90 BASE) MCG/ACT inhaler  Commonly known as:  PROVENTIL HFA;VENTOLIN HFA  Inhale 2 puffs into the lungs 3 (three) times daily as needed for wheezing or shortness of breath.     diphenhydrAMINE 25 MG tablet  Commonly known as:  BENADRYL  Take 25 mg by mouth at bedtime as needed for sleep.     lidocaine 2 % solution  Commonly known as:  XYLOCAINE  15 mL swished in the mouth and spit out no more frequently than every 3 hours ; 8 doses per 24-hour period)     naproxen 500 MG tablet  Commonly known as:  NAPROSYN  Take 1 tablet (500 mg total) by mouth 2 (two) times daily with a meal.     pantoprazole 20 MG tablet  Commonly  known as:  PROTONIX  Take 1 tablet (20 mg total) by mouth daily.     pregabalin 50 MG capsule  Commonly known as:  LYRICA  Take 1 capsule (50 mg total) by mouth 2 (two) times daily.     traMADol 50 MG tablet  Commonly known as:  ULTRAM  TAKE 1 TABLET BY MOUTH EVERY 8 HOURS AS NEEDED FOR PAIN     zolpidem 5 MG tablet  Commonly known as:  AMBIEN  TAKE 1 TABLET BY MOUTH ONCE DAILY AT BEDTIME AS NEEDED FOR SLEEP        Diagnostic Studies: No results found.        Follow-up Information    Follow up with YATES,MARK C, MD In 2 weeks.   Specialty:  Orthopedic Surgery   Contact information:   [address] [place] [address] [phone]       Discharge Plan:   discharge to home  Disposition:     Signed: Naida Sleight 11/16/2014, 10:44 AM

## 2014-11-23 ENCOUNTER — Other Ambulatory Visit (INDEPENDENT_AMBULATORY_CARE_PROVIDER_SITE_OTHER): Payer: Self-pay | Admitting: Internal Medicine

## 2014-11-23 ENCOUNTER — Other Ambulatory Visit: Payer: Self-pay | Admitting: Internal Medicine

## 2014-11-23 ENCOUNTER — Other Ambulatory Visit: Payer: Self-pay | Admitting: *Deleted

## 2014-11-23 MED ORDER — ZOLPIDEM TARTRATE 5 MG PO TABS: 5.0000 mg | ORAL_TABLET | Freq: Every evening | ORAL | Status: DC | PRN

## 2014-11-23 MED ORDER — TRAMADOL HCL 50 MG PO TABS: 50.0000 mg | ORAL_TABLET | Freq: Three times a day (TID) | ORAL | Status: DC | PRN

## 2014-11-23 NOTE — Telephone Encounter (Signed)
Called to pharm 

## 2014-11-27 ENCOUNTER — Ambulatory Visit: Payer: Medicaid Other | Admitting: Internal Medicine

## 2014-12-02 ENCOUNTER — Other Ambulatory Visit: Payer: Self-pay | Admitting: Internal Medicine

## 2014-12-08 ENCOUNTER — Ambulatory Visit: Payer: Medicaid Other | Admitting: Internal Medicine

## 2014-12-08 ENCOUNTER — Telehealth: Payer: Self-pay | Admitting: Internal Medicine

## 2014-12-08 NOTE — Telephone Encounter (Signed)
Call to patient to confirm appointment for 12/09/14 at 1:45 lmtcb

## 2014-12-09 ENCOUNTER — Ambulatory Visit: Payer: Medicaid Other | Admitting: Internal Medicine

## 2014-12-09 ENCOUNTER — Encounter: Payer: Self-pay | Admitting: Internal Medicine

## 2015-01-06 ENCOUNTER — Other Ambulatory Visit: Payer: Self-pay | Admitting: Internal Medicine

## 2015-01-11 NOTE — Telephone Encounter (Signed)
Rx called in to pharmacy. 

## 2015-01-21 ENCOUNTER — Other Ambulatory Visit: Payer: Self-pay | Admitting: *Deleted

## 2015-01-21 DIAGNOSIS — M549 Dorsalgia, unspecified: Secondary | ICD-10-CM

## 2015-01-22 NOTE — Telephone Encounter (Signed)
Per dr ngo pt must be seen for narcotic refill

## 2015-01-24 ENCOUNTER — Telehealth: Payer: Self-pay | Admitting: Internal Medicine

## 2015-01-24 DIAGNOSIS — M549 Dorsalgia, unspecified: Secondary | ICD-10-CM

## 2015-01-25 ENCOUNTER — Encounter: Payer: Self-pay | Admitting: Internal Medicine

## 2015-01-25 ENCOUNTER — Telehealth: Payer: Self-pay | Admitting: *Deleted

## 2015-01-25 MED ORDER — OXYCODONE HCL 10 MG PO TABS: 10.0000 mg | ORAL_TABLET | Freq: Four times a day (QID) | ORAL | Status: DC | PRN

## 2015-01-25 NOTE — Telephone Encounter (Signed)
Pt has set up an appt for 4/11 with you, will you fill her script to last til then, place an order for a UDS to be collected when she picks up her short term script, thanks, triage

## 2015-01-25 NOTE — Telephone Encounter (Signed)
Front desk pool had no appt  In next few days. Pt states having problems with kidney stones. Has had 2 seizures past month after hitting head. Suggest for pt to go to ER - states she has appt 02/01/15 in clinic and decided to wait. If any changes with kidney stones or seizures suggest ER. Stanton KidneyDebra Kailoni Vahle RN 01/25/15 12N.

## 2015-01-26 ENCOUNTER — Encounter: Payer: Self-pay | Admitting: *Deleted

## 2015-02-01 ENCOUNTER — Encounter: Payer: Medicaid Other | Admitting: Internal Medicine

## 2015-02-01 ENCOUNTER — Ambulatory Visit (INDEPENDENT_AMBULATORY_CARE_PROVIDER_SITE_OTHER): Payer: Medicaid Other | Admitting: Internal Medicine

## 2015-02-01 ENCOUNTER — Encounter: Payer: Self-pay | Admitting: Internal Medicine

## 2015-02-01 VITALS — BP 138/94 | HR 90 | Temp 98.8°F | Ht 60.0 in | Wt 110.5 lb

## 2015-02-01 DIAGNOSIS — M5416 Radiculopathy, lumbar region: Secondary | ICD-10-CM | POA: Diagnosis not present

## 2015-02-01 DIAGNOSIS — M5442 Lumbago with sciatica, left side: Secondary | ICD-10-CM

## 2015-02-01 DIAGNOSIS — G4700 Insomnia, unspecified: Secondary | ICD-10-CM

## 2015-02-01 DIAGNOSIS — R569 Unspecified convulsions: Secondary | ICD-10-CM | POA: Diagnosis not present

## 2015-02-01 DIAGNOSIS — M5410 Radiculopathy, site unspecified: Secondary | ICD-10-CM

## 2015-02-01 DIAGNOSIS — M549 Dorsalgia, unspecified: Secondary | ICD-10-CM

## 2015-02-01 MED ORDER — OXYCODONE HCL 10 MG PO TABS: 10.0000 mg | ORAL_TABLET | Freq: Four times a day (QID) | ORAL | Status: DC | PRN

## 2015-02-01 MED ORDER — PREGABALIN 100 MG PO CAPS: 100.0000 mg | ORAL_CAPSULE | Freq: Two times a day (BID) | ORAL | Status: DC

## 2015-02-01 MED ORDER — LEVETIRACETAM 500 MG PO TABS: 500.0000 mg | ORAL_TABLET | Freq: Two times a day (BID) | ORAL | Status: DC

## 2015-02-01 NOTE — Assessment & Plan Note (Signed)
Patient states that she has had insomnia that is both remarkable for trouble falling asleep and trouble staying asleep. Family members have noted that she snores very loudly. Patient states that she sometimes wakes up in the middle of night gasping for air. She reports that she has daytime fatigue and sleepiness. Although patient is not overweight, her description is concerning for obstructive sleep apnea. -Referral for sleep study limited due to orange card issues at this point. Resolution pending. -Strongly consider referral for polysomnography once orange card issues are resolved. -Offered trial of over-the-counter nasal clips for alleviation of airway obstruction.

## 2015-02-01 NOTE — Progress Notes (Signed)
Internal Medicine Clinic Attending  Case discussed with Dr. Ngo at the time of the visit.  We reviewed the resident's history and exam and pertinent patient test results.  I agree with the assessment, diagnosis, and plan of care documented in the resident's note. 

## 2015-02-01 NOTE — Assessment & Plan Note (Signed)
Patient has had back problems going back as far as 2002 when she had her first back surgery with laminectomy. She has been on chronic narcotics for many years since that surgery and leading up to a second surgery in December 2015 for a herniated nucleus pulposus with chronic back pain and left radiculopathy. Patient states that since her surgery, her pain has been constant and has not improved. She states that it is 8 out of 10 in severity and oxycodone helps marginally to reduce the pain. She was told by her neurosurgeon that the surgery may or may not help with her symptoms. She states that this pain and loss of function has significantly and adversely affected her quality of life. Upon physical exam, patient is grossly weak especially distally in the left lower extremity. According to the past documentation (please see Dr. Barnett ApplebaumJones's note from 04/03/2014), there was potentially some concern that some motor strength deficits observed on physical exam could be secondary to lack of effort. However, today, patient seems to be more objectively weak with lack of reflexes distally in the left lower extremity. -Refill oxycodone 10 mg every 6 hours #120. -Increase Lyrica from 50 mg twice a day to 100 mg twice a day. -Obtain records from neurosurgery follow-up appointment -Reassess in one month for this in addition to history of increased seizures. -May consider referral back to sports medicine at next visit (not able to do referrals for current visit because of orange card issues, resolution currently in progress)

## 2015-02-01 NOTE — Patient Instructions (Addendum)
I have refilled your oxycodone for the next month for your back pain. I have also increase your dosage of Lyrica to try to help better control your back pain.  It is concerning that you're having so many seizures over the last several months. I have prescribed a new medication called Keppra which is usually a well-tolerated medication that can help reduce or prevent seizures. Ultimately, he will need further workup from a neurologist. For now, because of your insurance card status, we cannot do this. In the meantime, I have ordered a MRI of your brain to help further characterize why he may be having seizures. Please do not operate any motor vehicles until you are seizure-free for a much longer period of time (6-12 months).  It does sound like he may have sleep apnea. However, we cannot make any referrals at this time. Please try some over-the-counter nose clips to see if they help for now. Please return to clinic in 4 weeks so that we can try to get more referrals at that point.  General Instructions:   Please bring your medicines with you each time you come to clinic.  Medicines may include prescription medications, over-the-counter medications, herbal remedies, eye drops, vitamins, or other pills.   Progress Toward Treatment Goals:  Treatment Goal 04/03/2014  Prevent falls at goal    Self Care Goals & Plans:  Self Care Goal 02/01/2015  Manage my medications take my medicines as prescribed; bring my medications to every visit; refill my medications on time  Eat healthy foods drink diet soda or water instead of juice or soda; eat more vegetables; eat foods that are low in salt; eat baked foods instead of fried foods  Be physically active -  Meeting treatment goals -    No flowsheet data found.   Care Management & Community Referrals:  Referral 04/03/2014  Referrals made for care management support none needed  Referrals made to community resources none

## 2015-02-01 NOTE — Assessment & Plan Note (Signed)
Patient noted to have seizures back in 2012 with a negative head CT noted. Patient had previously seen Dr. Terrace ArabiaYan of neurology. Because of the isolated nature of the seizure, no pharmacotherapy was initiated at that point. Upon visit today, patient states that she had a fall that resulted in her hitting her head against the counter in her kitchen several months ago in August 2015. She states that she may have lost consciousness at that point. Between that episode and today, patient states that she has had around 5 seizures. Her last seizure was about a month ago. Patient states that these episodes are remarkable for loss of consciousness. Witnesses have noted tonic-clonic movements throughout all 4 extremities. They last for several minutes. Patient states that she is confused and fatigued for several hours after each of these episodes. She states that they are associated with tongue biting which have been complicated by tongue infections in the past. Patient denies any loss of bowel or bladder function. Besides findings consistent with patient's radiculopathy, physical exam unremarkable for any other neurological abnormalities. Patient's last saw Dr. Terrace ArabiaYan several years ago.  -Referral back to neurology is limited at this point given issues with orange card. Resolution of these issues are pending. Hopefully can refer to neurology at patient's next appointment. -Begin Keppra 500 mg twice a day. Consider up titration at next appointment. -Brain MRI for further characterization for etiologies of exacerbated seizure disorder. -Patient to follow-up in one month.

## 2015-02-01 NOTE — Progress Notes (Signed)
   Subjective:    Patient ID: Victoria GlasgowBarbara J Reaves, female    DOB: 21-Nov-1975, 39 y.o.   MRN: [id number]  HPI  Patient is a [age] year old with a history of chronic back pain status post recent discectomy, anxiety, migraine, and seizures who presents to clinic for refill of her pain medications, seizures, and insomnia.  Please refer to separate problem-list charting for more details.  Review of Systems  Constitutional: Negative for fever and chills.  HENT: Negative for rhinorrhea and sore throat.   Eyes: Negative for visual disturbance.  Respiratory: Negative for cough and shortness of breath.   Cardiovascular: Negative for chest pain and palpitations.  Gastrointestinal: Negative for nausea, vomiting, abdominal pain, diarrhea, constipation and blood in stool.  Genitourinary: Negative for dysuria and hematuria.  Musculoskeletal: Positive for back pain.  Neurological: Positive for seizures. Negative for syncope.  Psychiatric/Behavioral: Positive for sleep disturbance.       Objective:   Physical Exam  Constitutional: She is oriented to person, place, and time. She appears well-developed and well-nourished. No distress.  HENT:  Head: Normocephalic and atraumatic.  Eyes: EOM are normal. Pupils are equal, round, and reactive to light.  Neck: Normal range of motion. Neck supple. No thyromegaly present.  Cardiovascular: Normal rate and regular rhythm.  Exam reveals no gallop and no friction rub.   No murmur heard. Pulmonary/Chest: Effort normal and breath sounds normal. No respiratory distress. She has no wheezes. She has no rales.  Abdominal: Soft. Bowel sounds are normal. She exhibits no distension. There is no tenderness. There is no rebound.  Musculoskeletal: She exhibits no edema.  Neurological: She is alert and oriented to person, place, and time.  Neuro: alert and oriented X3  - cranial nerves II through XII intact  - motor: LLE: 0 out of 5 in the ankle, 1 out of 5 in hip flexion, 1 out  of 5 in knee flexion and extension, RLE: 4 out of 5 throughout right lower extremity, strength 5 out of 5 in bilateral upper extremities  - Sensation: Intact to light touch throughout all 4 extremities  - Reflexes: 0 left Achilles reflex, 1+ left patellar reflex, 2+ patellar and Achilles reflex on the right  - Gait: Grossly abnormal without ability to bear weight on left lower extremity for extended periods of time.   Skin: Skin is warm and dry. No rash noted.  Multiple tattoos throughout  Psychiatric: She has a normal mood and affect. Thought content normal.      Assessment & Plan:  Please refer to separate problem-list charting for more details.

## 2015-02-26 ENCOUNTER — Other Ambulatory Visit: Payer: Self-pay | Admitting: Internal Medicine

## 2015-02-27 ENCOUNTER — Other Ambulatory Visit: Payer: Self-pay | Admitting: Internal Medicine

## 2015-02-27 DIAGNOSIS — M549 Dorsalgia, unspecified: Secondary | ICD-10-CM

## 2015-02-27 MED ORDER — OXYCODONE HCL 10 MG PO TABS: 10.0000 mg | ORAL_TABLET | Freq: Four times a day (QID) | ORAL | Status: DC | PRN

## 2015-02-27 MED ORDER — LEVETIRACETAM 500 MG PO TABS: 500.0000 mg | ORAL_TABLET | Freq: Two times a day (BID) | ORAL | Status: DC

## 2015-03-02 ENCOUNTER — Other Ambulatory Visit: Payer: Self-pay | Admitting: *Deleted

## 2015-03-02 ENCOUNTER — Encounter: Payer: Self-pay | Admitting: *Deleted

## 2015-03-02 ENCOUNTER — Encounter: Payer: Self-pay | Admitting: Internal Medicine

## 2015-03-02 DIAGNOSIS — M549 Dorsalgia, unspecified: Secondary | ICD-10-CM

## 2015-03-03 ENCOUNTER — Other Ambulatory Visit: Payer: Self-pay | Admitting: *Deleted

## 2015-03-03 DIAGNOSIS — M549 Dorsalgia, unspecified: Secondary | ICD-10-CM

## 2015-03-03 NOTE — Telephone Encounter (Signed)
done

## 2015-03-19 ENCOUNTER — Ambulatory Visit (HOSPITAL_COMMUNITY): Admission: RE | Admit: 2015-03-19 | Payer: Medicaid Other | Source: Ambulatory Visit

## 2015-03-28 ENCOUNTER — Other Ambulatory Visit: Payer: Self-pay | Admitting: Internal Medicine

## 2015-03-29 NOTE — Telephone Encounter (Signed)
Called to pharm 

## 2015-03-30 ENCOUNTER — Encounter: Payer: Self-pay | Admitting: Internal Medicine

## 2015-03-30 ENCOUNTER — Other Ambulatory Visit: Payer: Self-pay | Admitting: Internal Medicine

## 2015-03-30 NOTE — Telephone Encounter (Signed)
Rx called in to pharmacy. Stanton KidneyDebra Tanica Gaige RN 03/30/15 4:50PM

## 2015-04-05 ENCOUNTER — Encounter: Payer: Self-pay | Admitting: *Deleted

## 2015-04-26 ENCOUNTER — Other Ambulatory Visit: Payer: Self-pay | Admitting: Internal Medicine

## 2015-04-26 ENCOUNTER — Encounter: Payer: Self-pay | Admitting: Internal Medicine

## 2015-04-27 ENCOUNTER — Other Ambulatory Visit: Payer: Self-pay | Admitting: *Deleted

## 2015-04-27 DIAGNOSIS — M549 Dorsalgia, unspecified: Secondary | ICD-10-CM

## 2015-04-27 MED ORDER — LEVETIRACETAM 500 MG PO TABS: 500.0000 mg | ORAL_TABLET | Freq: Two times a day (BID) | ORAL | Status: DC

## 2015-04-27 MED ORDER — OXYCODONE HCL 10 MG PO TABS: 10.0000 mg | ORAL_TABLET | Freq: Four times a day (QID) | ORAL | Status: DC | PRN

## 2015-04-27 NOTE — Telephone Encounter (Signed)
Tried to call, no answer

## 2015-04-27 NOTE — Telephone Encounter (Signed)
Med request done

## 2015-04-28 ENCOUNTER — Other Ambulatory Visit: Payer: Self-pay | Admitting: Internal Medicine

## 2015-04-28 DIAGNOSIS — G479 Sleep disorder, unspecified: Secondary | ICD-10-CM

## 2015-04-28 NOTE — Telephone Encounter (Signed)
Called today, a female stated she was not there

## 2015-04-29 NOTE — Telephone Encounter (Signed)
Called , lm again for rtc

## 2015-04-29 NOTE — Telephone Encounter (Signed)
Pt.notified

## 2015-05-08 ENCOUNTER — Other Ambulatory Visit: Payer: Self-pay | Admitting: Internal Medicine

## 2015-05-21 ENCOUNTER — Encounter: Payer: Self-pay | Admitting: Internal Medicine

## 2015-05-21 ENCOUNTER — Ambulatory Visit (INDEPENDENT_AMBULATORY_CARE_PROVIDER_SITE_OTHER): Payer: Medicaid Other | Admitting: Internal Medicine

## 2015-05-21 VITALS — BP 130/65 | HR 72 | Temp 97.8°F | Ht 60.0 in | Wt 108.7 lb

## 2015-05-21 DIAGNOSIS — M549 Dorsalgia, unspecified: Secondary | ICD-10-CM

## 2015-05-21 DIAGNOSIS — F410 Panic disorder [episodic paroxysmal anxiety] without agoraphobia: Secondary | ICD-10-CM

## 2015-05-21 DIAGNOSIS — M5416 Radiculopathy, lumbar region: Secondary | ICD-10-CM | POA: Diagnosis not present

## 2015-05-21 DIAGNOSIS — G40909 Epilepsy, unspecified, not intractable, without status epilepticus: Secondary | ICD-10-CM

## 2015-05-21 DIAGNOSIS — R569 Unspecified convulsions: Secondary | ICD-10-CM

## 2015-05-21 DIAGNOSIS — G4700 Insomnia, unspecified: Secondary | ICD-10-CM

## 2015-05-21 DIAGNOSIS — F419 Anxiety disorder, unspecified: Secondary | ICD-10-CM

## 2015-05-21 DIAGNOSIS — M5410 Radiculopathy, site unspecified: Secondary | ICD-10-CM

## 2015-05-21 MED ORDER — OXYCODONE HCL 10 MG PO TABS: 10.0000 mg | ORAL_TABLET | Freq: Four times a day (QID) | ORAL | Status: DC | PRN

## 2015-05-21 NOTE — Progress Notes (Signed)
Patient ID: Victoria Ellis, female   DOB: 1975-12-31, 39 y.o.    Subjective:   Patient ID: Victoria Ellis female   DOB: 1976/08/14 39 y.o.   MRN: [id number]  HPI: Victoria Ellis is a 39 y.o. female presenting for follow up for her chronic lower back pain with radiculopathy, insomnia, and seizures.    Past Medical History  Diagnosis Date  . Chronic back pain 2000      1.  Postoperative changes left hemilaminotomy L5-S1.  Marland Kitchen Foot drop     left foot  . Abnormal Pap smear   . Nerve damage     bilateral legs   . Complication of anesthesia     11/15 while having tooth extraction woke up.  . Seizures     10/06/14- over 6 months ago- "maybe antidepressant- Zoloft.  . Asthma   . Anxiety   . Depression   . GERD (gastroesophageal reflux disease)   . Constipation    Current Outpatient Prescriptions  Medication Sig Dispense Refill  . albuterol (PROVENTIL HFA;VENTOLIN HFA) 108 (90 BASE) MCG/ACT inhaler Inhale 2 puffs into the lungs 3 (three) times daily as needed for wheezing or shortness of breath.    . diphenhydrAMINE (BENADRYL) 25 MG tablet Take 25 mg by mouth at bedtime as needed for sleep.    Marland Kitchen levETIRAcetam (KEPPRA) 500 MG tablet Take 1 tablet (500 mg total) by mouth 2 (two) times daily. 60 tablet 5  . Oxycodone HCl 10 MG TABS Take 1 tablet (10 mg total) by mouth every 6 (six) hours as needed. 120 tablet 0  . pregabalin (LYRICA) 100 MG capsule Take 1 capsule (100 mg total) by mouth 2 (two) times daily. 60 capsule 1  . traMADol (ULTRAM) 50 MG tablet Take 50 mg by mouth 3 (three) times daily as needed.  5  . zolpidem (AMBIEN) 5 MG tablet Take 1 tablet (5 mg total) by mouth at bedtime as needed for sleep. 30 tablet 5   No current facility-administered medications for this visit.   Family History  Problem Relation Age of Onset  . Hypertension Mother   . Alcohol abuse Mother   . Epilepsy Daughter    History   Social History  . Marital Status: Single    Spouse Name: N/A  .  Number of Children: N/A  . Years of Education: N/A   Social History Main Topics  . Smoking status: Never Smoker   . Smokeless tobacco: Never Used  . Alcohol Use: No  . Drug Use: No  . Sexual Activity: Yes    Birth Control/ Protection: None   Other Topics Concern  . None   Social History Narrative   Review of Systems: Review of Systems  Constitutional: Negative for weight loss.  Eyes: Negative for blurred vision and double vision.  Respiratory: Negative for cough.   Cardiovascular: Negative for chest pain and palpitations.  Gastrointestinal: Negative for nausea, vomiting, diarrhea and constipation.  Genitourinary: Negative for dysuria and urgency.  Musculoskeletal: Positive for back pain and joint pain.  Neurological: Positive for tingling, weakness and headaches.  Psychiatric/Behavioral: The patient has insomnia.     Objective:  Physical Exam: Filed Vitals:   05/21/15 1427  BP: 130/65  Pulse: 72  Temp: 97.8 F (36.6 C)  TempSrc: Oral  Height: 5' (1.524 m)  Weight: 108 lb 11.2 oz (49.306 kg)  SpO2: 100%    GENERAL- alert, co-operative, NAD HEENT- Atraumatic, normocephalic, PERRL, EOMI, oral mucosa appears moist, good and intact  dentition CARDIAC- RRR, no murmurs, rubs or gallops RESP- CTAB, no wheezes or crackles ABDOMEN- Soft, nontender, no guarding or rebound BACK- Paraspinal tenderness below L2 no CVA tenderness NEURO- No obvious cranial nerve deficit. Motor: LLE: 0/5 left foot dorsiflexion, 2/5 plantarflexion, 1/5 knee extension, 2/5 knee flexion, 1/5 hip flexion 4/5 hip extension; RLE 5/5 throughout Sensation: LLE diminished sensation to light touch unable to localize distal to ankle Reflexes: 2+ patellar and achilles RLE, 2+ patellar and 0 achilles LLE Gait abnormal walking with L sided weight on cane EXTREMITIES- pulse 2+, symmetric, no pedal edema, muscle tone in calf L<R SKIN- Warm, dry, No rash or lesion PSYCH- Anxious appearing, appropriate thought  content and speech   Assessment & Plan:

## 2015-05-21 NOTE — Assessment & Plan Note (Signed)
See anxiety

## 2015-05-21 NOTE — Progress Notes (Addendum)
Patient ID: Victoria Ellis, female   DOB: [birth year], 39 y.o.   MRN: [id number]  Patient left [facility] visit today without collecting Oxycodone  q6hrs #120 tablets script or providing urine sample for UDS as requested according to pain contract signed 2014. This prescription was destroyed, will provide new prescription after she is able to return to clinic and provide sample for UDS. Script will be written for fill no earlier than 05/31/15.

## 2015-05-21 NOTE — Assessment & Plan Note (Addendum)
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 Was previously on zoloft or sertraline for management, now no therapy. No recommendation to start at this time given recent resolution of seizures and multiple psychoactive drugs. If anxiety remains major barrier to functional status may consider restarting treatment. - Discuss anxiety control at subsequent [facility] appt

## 2015-05-21 NOTE — Assessment & Plan Note (Signed)
Reports no seizures since starting Keppra  BID in April 2016. On review patient with positive history of seizure disorder and is on numerous medications lowering seizure threshold. Would defer neurology referral at this time given that she is asymptomatic on starting dose treatment. - Continue Keppra  BID - Adjust dose above starting dose if needed - Neurology referral if symptoms recur on therapy

## 2015-05-21 NOTE — Assessment & Plan Note (Signed)
Insomnia with associated snoring, waking with air hunger, numerous sleep interruptions per night. Fatigue and excessive daytime sleepiness. She has headaches on waking more than half of mornings, which she states sometimes progress to migraine headaches. - Sleep study scheduled 06/22/15, F/U at next [facility] appointment

## 2015-05-21 NOTE — Addendum Note (Signed)
Addended by: Remus Blake on: 05/21/2015 04:18 PM   Modules accepted: Orders
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
## 2015-05-21 NOTE — Patient Instructions (Signed)
Follow up at 8/30 Sleep study appointment. You will be called with follow up appointment at [facility] in the next 2-3 months to discuss these findings and any treatment recommendations. Medication refill at that time as needed.

## 2015-05-27 ENCOUNTER — Other Ambulatory Visit: Payer: Self-pay | Admitting: Internal Medicine

## 2015-05-27 DIAGNOSIS — M549 Dorsalgia, unspecified: Secondary | ICD-10-CM

## 2015-05-27 NOTE — Addendum Note (Signed)
Addended by: Debe Coder B on: 05/27/2015 03:12 PM   Modules accepted: Level of Service

## 2015-05-27 NOTE — Telephone Encounter (Signed)
It looks like this Rx refill was done at last visit. Message to Pt asking her to call me back about this.

## 2015-05-28 MED ORDER — OXYCODONE HCL 10 MG PO TABS: 10.0000 mg | ORAL_TABLET | Freq: Four times a day (QID) | ORAL | Status: DC | PRN

## 2015-05-28 NOTE — Addendum Note (Signed)
Addended by: Fuller Plan on: 05/28/2015 09:48 AM   Modules accepted: Orders, Medications

## 2015-05-28 NOTE — Telephone Encounter (Signed)
Refilled today.  Pt informed

## 2015-05-31 ENCOUNTER — Other Ambulatory Visit: Payer: Medicaid Other

## 2015-05-31 DIAGNOSIS — M5410 Radiculopathy, site unspecified: Secondary | ICD-10-CM

## 2015-06-01 NOTE — Progress Notes (Signed)
Internal Medicine Clinic Attending  I saw and evaluated the patient.  I personally confirmed the key portions of the history and exam documented by Dr. Rice and I reviewed pertinent patient test results.  The assessment, diagnosis, and plan were formulated together and I agree with the documentation in the resident's note.  

## 2015-06-06 ENCOUNTER — Other Ambulatory Visit: Payer: Self-pay | Admitting: Internal Medicine

## 2015-06-07 ENCOUNTER — Other Ambulatory Visit: Payer: Self-pay | Admitting: Internal Medicine

## 2015-06-07 ENCOUNTER — Encounter: Payer: Self-pay | Admitting: Internal Medicine

## 2015-06-07 MED ORDER — PREGABALIN 100 MG PO CAPS: 100.0000 mg | ORAL_CAPSULE | Freq: Two times a day (BID) | ORAL | Status: DC

## 2015-06-07 MED ORDER — TRAMADOL HCL 50 MG PO TABS: 50.0000 mg | ORAL_TABLET | Freq: Three times a day (TID) | ORAL | Status: DC | PRN

## 2015-06-07 MED ORDER — ZOLPIDEM TARTRATE 5 MG PO TABS: 5.0000 mg | ORAL_TABLET | Freq: Every evening | ORAL | Status: DC | PRN

## 2015-06-07 NOTE — Telephone Encounter (Signed)
All Rx called in to pharmacy.

## 2015-06-07 NOTE — Telephone Encounter (Signed)
Pharmacy called and last fill dates/current refills checked, consistent with expected values.

## 2015-06-10 LAB — OXYCODONE/OXYMORPHONE CONFIRM
OXYCODONE/OXYMORPH: POSITIVE — AB
OXYCODONE: >3000 ng/mL
OXYCODONE: POSITIVE — AB
OXYMORPHONE: NEGATIVE

## 2015-06-10 LAB — PRESCRIPTION ABUSE MONITORING 17P, URINE
6-ACETYLMORPHINE, URINE: NEGATIVE ng/mL
AMPHETAMINE SCREEN URINE: NEGATIVE ng/mL
BARBITURATE SCREEN URINE: NEGATIVE ng/mL
BENZODIAZEPINE SCREEN, URINE: NEGATIVE ng/mL
Buprenorphine, Urine: NEGATIVE ng/mL
CANNABINOIDS UR QL SCN: NEGATIVE ng/mL
CARISOPRODOL/MEPROBAMATE, UR: NEGATIVE ng/mL
CREATININE(CRT), U: 139.6 mg/dL (ref 20.0–300.0)
Cocaine (Metab) Scrn, Ur: NEGATIVE ng/mL
EDDP, Urine: NEGATIVE ng/mL
Fentanyl, Urine: NEGATIVE pg/mL
MDMA SCREEN, URINE: NEGATIVE ng/mL
Meperidine Screen, Urine: NEGATIVE ng/mL
Methadone Screen, Urine: NEGATIVE ng/mL
NITRITE URINE, QUANTITATIVE: NEGATIVE ug/mL
PHENCYCLIDINE QUANTITATIVE URINE: NEGATIVE ng/mL
Ph of Urine: 5.7 (ref 4.5–8.9)
Propoxyphene Scrn, Ur: NEGATIVE ng/mL
SPECIFIC GRAVITY: 1.0288
TAPENTADOL, URINE: NEGATIVE ng/mL

## 2015-06-10 LAB — TRAMADOL (GC/MS), URINE
Tramadol (GC/MS): 18396 ng/mL
Tramadol: POSITIVE — AB

## 2015-06-10 LAB — OPIATES CONFIRMATION, URINE: Opiates: NEGATIVE ng/mL

## 2015-06-22 ENCOUNTER — Ambulatory Visit (HOSPITAL_BASED_OUTPATIENT_CLINIC_OR_DEPARTMENT_OTHER): Payer: Medicaid Other | Attending: Internal Medicine

## 2015-06-22 DIAGNOSIS — R0683 Snoring: Secondary | ICD-10-CM | POA: Insufficient documentation

## 2015-06-22 DIAGNOSIS — G479 Sleep disorder, unspecified: Secondary | ICD-10-CM | POA: Diagnosis present

## 2015-06-24 ENCOUNTER — Other Ambulatory Visit: Payer: Self-pay | Admitting: Internal Medicine

## 2015-06-24 ENCOUNTER — Other Ambulatory Visit: Payer: Self-pay | Admitting: *Deleted

## 2015-06-24 NOTE — Telephone Encounter (Signed)
Last filled at pharmacy 05/31/2015 Last visit 05/21/2015 No future appt scheduled, will schedule when she picks up script Last UDS 05/31/2015

## 2015-06-25 ENCOUNTER — Other Ambulatory Visit: Payer: Self-pay | Admitting: Internal Medicine

## 2015-06-25 MED ORDER — OXYCODONE HCL 10 MG PO TABS: 10.0000 mg | ORAL_TABLET | Freq: Four times a day (QID) | ORAL | Status: DC | PRN

## 2015-06-25 NOTE — Telephone Encounter (Signed)
Message left for pt to call clinic - Rx is ready. Stanton Kidney Ernesto Zukowski RN 06/25/15 3PM

## 2015-06-28 ENCOUNTER — Encounter: Payer: Self-pay | Admitting: Internal Medicine

## 2015-06-30 ENCOUNTER — Telehealth: Payer: Self-pay | Admitting: *Deleted

## 2015-06-30 ENCOUNTER — Other Ambulatory Visit: Payer: Self-pay | Admitting: Internal Medicine

## 2015-06-30 MED ORDER — OXYCODONE HCL 10 MG PO TABS: 10.0000 mg | ORAL_TABLET | Freq: Four times a day (QID) | ORAL | Status: DC | PRN

## 2015-06-30 NOTE — Telephone Encounter (Signed)
Pt presents at front desk stating pharm would not accept her narcotic script because Dr Dimple Casey is not registered with medicaid Script needs to be reprinted

## 2015-07-01 NOTE — Telephone Encounter (Signed)
Dr. Rogelia Boga,  This is not the first patient who has had trouble with prescriptions from me requiring DEA #. Patients I discharged from the hospital since the start of this month have also been calling with trouble requiring my resident to re-write for them. I believe Clydie Braun already mentioned this to you. Let me know if you learn anything about it please. Thanks.  Orest Dikes

## 2015-07-01 NOTE — Telephone Encounter (Signed)
Tyler Aas is aware of this and is looking into it and how to fix it. Will let you know when we know more.

## 2015-07-02 ENCOUNTER — Telehealth: Payer: Self-pay | Admitting: Internal Medicine

## 2015-07-02 NOTE — Telephone Encounter (Signed)
Call to patient to confirm appointment for 07/05/15 at 3:45 lmtcb ° °

## 2015-07-03 ENCOUNTER — Encounter (HOSPITAL_BASED_OUTPATIENT_CLINIC_OR_DEPARTMENT_OTHER): Payer: Medicaid Other | Admitting: Internal Medicine

## 2015-07-03 DIAGNOSIS — G479 Sleep disorder, unspecified: Secondary | ICD-10-CM | POA: Diagnosis not present

## 2015-07-03 NOTE — Progress Notes (Signed)
  Patient Name: Victoria Ellis, Victoria Ellis Date: 06/22/2015 Gender: Female D.O.B: 18-Jul-1976 Age (years): 39 Referring Provider: Burns Spain Height (inches): 59 Interpreting Physician: Jetty Duhamel MD, ABSM Weight (lbs): 110 RPSGT: Melburn Popper BMI: 22 MRN: [id number] Neck Size: 13.00 CLINICAL INFORMATION Sleep Study Type: NPSG  Indication for sleep study: N/A  Epworth Sleepiness Score: 8  SLEEP STUDY TECHNIQUE As per the AASM Manual for the Scoring of Sleep and Associated Events v2.3 (April 2016) with a hypopnea requiring 4% desaturations.  The channels recorded and monitored were frontal, central and occipital EEG, electrooculogram (EOG), submentalis EMG (chin), nasal and oral airflow, thoracic and abdominal wall motion, anterior tibialis EMG, snore microphone, electrocardiogram, and pulse oximetry.  MEDICATIONS Patient's medications include: Charted for review. Medications self-administered by patient during sleep study : No sleep medicine administered.  SLEEP ARCHITECTURE The study was initiated at 10:01:18 PM and ended at 4:56:48 AM.  Sleep onset time was 7.8 minutes and the sleep efficiency was 91.6%. The total sleep time was 380.7 minutes.  Stage REM latency was 216.5 minutes.  The patient spent 2.50% of the night in stage N1 sleep, 86.34% in stage N2 sleep, 0.66% in stage N3 and 10.51% in REM.  Alpha intrusion was absent.  Supine sleep was 26.79%.  Awake after sleep onset 27 minutes  RESPIRATORY PARAMETERS The overall apnea/hypopnea index (AHI) was 3.3 per hour. There were 21 total apneas, including 0 obstructive, 20 central and 1 mixed apneas. There were 0 hypopneas and 2 RERAs.  The AHI during Stage REM sleep was 1.5 per hour.  AHI while supine was 9.4 per hour.  The mean oxygen saturation was 97.58%. The minimum SpO2 during sleep was 96.00%.  Moderate snoring was noted during this study.  CARDIAC DATA The 2 lead EKG demonstrated sinus rhythm.  The mean heart rate was 61.37 beats per minute. Other EKG findings include: None.  LEG MOVEMENT DATA The total PLMS were 0 with a resulting PLMS index of 0.00. Associated arousal with leg movement index was 0.0 .  IMPRESSIONS No significant obstructive sleep apnea occurred during this study (AHI = 3.3/h). No significant central sleep apnea occurred during this study (CAI = 3.2/h). The patient had minimal or no oxygen desaturation during the study (Min O2 = 96.00%) The patient snored with Moderate snoring volume. No cardiac abnormalities were noted during this study. Clinically significant periodic limb movements did not occur during sleep. No significant associated arousals. Although no medication use was recorded in association with this study, the high percentage of time spent in Stage N2, with little arousal, suggests medication effect.  DIAGNOSIS Primary Snoring (786.09 [R06.83 ICD-10]) Normal study Question medication effect  RECOMMENDATIONS Avoid alcohol, sedatives and other CNS depressants that may worsen sleep apnea and disrupt normal sleep architecture. Sleep hygiene should be reviewed to assess factors that may improve sleep quality. Weight management and regular exercise should be initiated or continued if appropriate.  Waymon Budge Diplomate, American Board of Sleep Medicine  ELECTRONICALLY SIGNED ON:  07/03/2015, 10:08 AM [facility] PH: [phone]   FX: [phone] ACCREDITED BY THE AMERICAN ACADEMY OF SLEEP MEDICINE

## 2015-07-05 ENCOUNTER — Ambulatory Visit: Payer: Medicaid Other | Admitting: Internal Medicine

## 2015-07-05 ENCOUNTER — Encounter: Payer: Self-pay | Admitting: Internal Medicine

## 2015-07-23 ENCOUNTER — Other Ambulatory Visit: Payer: Self-pay | Admitting: Internal Medicine

## 2015-07-26 ENCOUNTER — Encounter: Payer: Self-pay | Admitting: Internal Medicine

## 2015-07-26 ENCOUNTER — Other Ambulatory Visit: Payer: Self-pay | Admitting: Internal Medicine

## 2015-07-26 NOTE — Telephone Encounter (Signed)
Last filled 9/7 Next visit should be around oct 29, no appt scheduled

## 2015-07-27 MED ORDER — OXYCODONE HCL 10 MG PO TABS: 10.0000 mg | ORAL_TABLET | Freq: Four times a day (QID) | ORAL | Status: DC | PRN

## 2015-07-27 NOTE — Telephone Encounter (Signed)
Refilled oxycodone  #120 tabs today for fill 07/30/15 or later. Patient does need new appointment to be seen, preferably in timely fashion/also needs influenza vaccination.

## 2015-08-17 ENCOUNTER — Other Ambulatory Visit: Payer: Self-pay | Admitting: Internal Medicine

## 2015-08-20 NOTE — Telephone Encounter (Signed)
Spoke with pharmacy.  Pt refilled her tramadol on 8/15, 9/12, and 10/5 so is not due until 11/5 based on last refill.

## 2015-08-23 ENCOUNTER — Other Ambulatory Visit: Payer: Self-pay | Admitting: Internal Medicine

## 2015-08-23 NOTE — Telephone Encounter (Signed)
Tramadol was refused recently - pharmacy states refills on med 06/07/15, 07/05/15 and 07/28/15. On oxycodone last refill 07/29/15 #120, last office visit 05/21/15, no show appt 07/05/15 and no future appt. Last UDS 05/31/15. Stanton KidneyDebra Chalene Treu RN 08/23/15 9:30AM

## 2015-08-26 MED ORDER — OXYCODONE HCL 10 MG PO TABS: 10.0000 mg | ORAL_TABLET | Freq: Four times a day (QID) | ORAL | Status: DC | PRN

## 2015-08-26 MED ORDER — TRAMADOL HCL 50 MG PO TABS: 50.0000 mg | ORAL_TABLET | Freq: Three times a day (TID) | ORAL | Status: DC | PRN

## 2015-08-26 NOTE — Telephone Encounter (Signed)
Tramadol called in to pharmacy. Left message for pt to call clinic about Rx is ready.

## 2015-09-22 ENCOUNTER — Other Ambulatory Visit: Payer: Self-pay | Admitting: Internal Medicine

## 2015-09-23 ENCOUNTER — Other Ambulatory Visit: Payer: Self-pay

## 2015-09-23 NOTE — Telephone Encounter (Signed)
Last refill 11/6 Last office visit 7/29 UDS 8/8 No sow 9/12 No future appointment

## 2015-09-24 MED ORDER — OXYCODONE HCL 10 MG PO TABS: 10.0000 mg | ORAL_TABLET | Freq: Four times a day (QID) | ORAL | Status: DC | PRN

## 2015-09-27 ENCOUNTER — Encounter: Payer: Self-pay | Admitting: Internal Medicine

## 2015-09-27 ENCOUNTER — Ambulatory Visit: Payer: Medicaid Other | Admitting: Internal Medicine

## 2015-09-27 ENCOUNTER — Other Ambulatory Visit: Payer: Self-pay | Admitting: Internal Medicine

## 2015-09-27 NOTE — Telephone Encounter (Signed)
LVM that RX ready for pick-up

## 2015-10-22 ENCOUNTER — Other Ambulatory Visit: Payer: Self-pay | Admitting: Internal Medicine

## 2015-10-26 ENCOUNTER — Other Ambulatory Visit: Payer: Self-pay | Admitting: Internal Medicine

## 2015-10-26 NOTE — Telephone Encounter (Signed)
Last refill 12/6 Last OV 7/29 (2 no shows since) No future appointments scheduled  Last UDS 8/8

## 2015-10-27 MED ORDER — OXYCODONE HCL 10 MG PO TABS: 10.0000 mg | ORAL_TABLET | Freq: Four times a day (QID) | ORAL | Status: AC | PRN

## 2015-11-09 ENCOUNTER — Encounter: Payer: Self-pay | Admitting: Internal Medicine

## 2015-11-09 ENCOUNTER — Other Ambulatory Visit: Payer: Self-pay

## 2015-11-09 ENCOUNTER — Other Ambulatory Visit: Payer: Self-pay | Admitting: Internal Medicine

## 2015-11-10 MED ORDER — TRAMADOL HCL 50 MG PO TABS: 50.0000 mg | ORAL_TABLET | Freq: Three times a day (TID) | ORAL | Status: DC | PRN

## 2015-11-10 NOTE — Telephone Encounter (Signed)
Phoned rx to [facility]

## 2015-11-23 ENCOUNTER — Other Ambulatory Visit: Payer: Self-pay | Admitting: Internal Medicine

## 2015-11-23 ENCOUNTER — Other Ambulatory Visit: Payer: Self-pay | Admitting: *Deleted

## 2015-11-23 NOTE — Telephone Encounter (Signed)
Has not been seen since 05/21/2015 uds 05/2015 Last written 10/27/2015

## 2015-11-24 ENCOUNTER — Encounter: Payer: Self-pay | Admitting: Internal Medicine

## 2015-11-24 NOTE — Telephone Encounter (Signed)
appt made for 2/3 at pt's request, refused Thursday, dr Mikey Bussing

## 2015-11-24 NOTE — Telephone Encounter (Signed)
Have tried to call pt twice to schedule appt asap with someone in clinic per conversation w/ dr Midwife and dr boswell's note. Will await rtc

## 2015-11-25 ENCOUNTER — Telehealth: Payer: Self-pay | Admitting: Internal Medicine

## 2015-11-25 NOTE — Telephone Encounter (Signed)
Call to patient to confirm appointment for 11/26/15 at 10:45 lmtcb ° °

## 2015-11-26 ENCOUNTER — Encounter: Payer: Self-pay | Admitting: Internal Medicine

## 2015-11-26 ENCOUNTER — Ambulatory Visit (INDEPENDENT_AMBULATORY_CARE_PROVIDER_SITE_OTHER): Payer: Self-pay | Admitting: Internal Medicine

## 2015-11-26 VITALS — BP 139/89 | HR 102 | Temp 98.0°F | Wt 121.8 lb

## 2015-11-26 DIAGNOSIS — F419 Anxiety disorder, unspecified: Secondary | ICD-10-CM

## 2015-11-26 DIAGNOSIS — Z79899 Other long term (current) drug therapy: Secondary | ICD-10-CM | POA: Diagnosis not present

## 2015-11-26 DIAGNOSIS — F411 Generalized anxiety disorder: Secondary | ICD-10-CM

## 2015-11-26 DIAGNOSIS — M5410 Radiculopathy, site unspecified: Secondary | ICD-10-CM

## 2015-11-26 DIAGNOSIS — M5416 Radiculopathy, lumbar region: Secondary | ICD-10-CM

## 2015-11-26 DIAGNOSIS — G8929 Other chronic pain: Secondary | ICD-10-CM

## 2015-11-26 DIAGNOSIS — Z79891 Long term (current) use of opiate analgesic: Secondary | ICD-10-CM

## 2015-11-26 DIAGNOSIS — M545 Low back pain: Secondary | ICD-10-CM

## 2015-11-26 MED ORDER — OXYCODONE HCL 10 MG PO TABS: 10.0000 mg | ORAL_TABLET | Freq: Four times a day (QID) | ORAL | Status: DC | PRN

## 2015-11-26 MED ORDER — DULOXETINE HCL 30 MG PO CPEP: ORAL_CAPSULE | ORAL | Status: DC

## 2015-11-26 MED ORDER — NALOXONE HCL 0.4 MG/0.4ML IJ SOAJ: 0.4000 mg | INTRAMUSCULAR | Status: AC | PRN

## 2015-11-26 NOTE — Assessment & Plan Note (Addendum)
HPI: She has chronic low back pain and has had chronic narcotic therapy.  She takes Oxycodone  reguarlly every 6 hours and is prescribed 120 pills a month.  Occaisonally she uses Tramadol but reports that it is not effective.  She has previously had a prescription of Lyrica but cannot afford this due to forumlary changes, Gabapentin was not effective.  She last had a pain contract signed in 2014 with Dr Manson Passey.  Unfortuantely she has no showed to her last 2 appointments and was told she needed to come in today for a refill.   She reports that the oxycodone does not relieve her pain completely but helps her to be able to tolerate minimal activity required for her daily activities.  A: Chronic back pain  P: - Obtain UDS to verify presence of Oxycodone that she reports was last taken this morning.  Tramadol was last taken 3 days ago. - Refill Oxycodone  Q6PRN per previous pain contract #120. I have printed a second script that she can pick up in 1 month pending evaluation of UDS. -  Recommended she use tramadol sparingly and would recommend eventually d/c of this medication due to her seziure disorder especially since it is not effective. - She is prescribed , I have discussed and provided her a Rx for Naloxone - We have made her a follow up appointment with her PCP in 6 weeks (first available) I have told her she MUST attend this appointment and sign a new (updated) pain contract with her PCP.

## 2015-11-26 NOTE — Progress Notes (Signed)
[facility] Subjective:   Patient ID: Victoria Ellis female   DOB: 07-15-1976 40 y.o.   MRN: [id number]  HPI: [name] is a 40 y.o. female with a PMH detailed below who presents for a refill of her chronic pain medication.  Please see problem based charting below for the status of her chronic medical problems.     Past Medical History  Diagnosis Date  . Chronic back pain 2000      1.  Postoperative changes left hemilaminotomy L5-S1.  Marland Kitchen Foot drop     left foot  . Abnormal Pap smear   . Nerve damage     bilateral legs   . Complication of anesthesia     11/15 while having tooth extraction woke up.  . Seizures (HCC)     10/06/14- over 6 months ago- "maybe antidepressant- Zoloft.  . Asthma   . Anxiety   . Depression   . GERD (gastroesophageal reflux disease)   . Constipation    Current Outpatient Prescriptions  Medication Sig Dispense Refill  . albuterol (PROVENTIL HFA;VENTOLIN HFA) 108 (90 BASE) MCG/ACT inhaler Inhale 2 puffs into the lungs 3 (three) times daily as needed for wheezing or shortness of breath.    . levETIRAcetam (KEPPRA) 500 MG tablet Take 1 tablet (500 mg total) by mouth 2 (two) times daily. 60 tablet 5  . traMADol (ULTRAM) 50 MG tablet Take 1 tablet (50 mg total) by mouth 3 (three) times daily as needed for moderate pain or severe pain. 90 tablet 2  . zolpidem (AMBIEN) 5 MG tablet Take 1 tablet (5 mg total) by mouth at bedtime as needed for sleep. 30 tablet 5  . diphenhydrAMINE (BENADRYL) 25 MG tablet Take 25 mg by mouth at bedtime as needed for sleep. Reported on 11/26/2015    . DULoxetine (CYMBALTA) 30 MG capsule 30 mg daily for 1 week then increase to  daily. 60 capsule 2  . Naloxone HCl (EVZIO) 0.4 MG/0.4ML SOAJ Inject 0.4 mg as directed as needed. (Patient not taking: Reported on 11/26/2015) 1 Package 1  . Oxycodone HCl 10 MG TABS Take 1 tablet (10 mg total) by mouth every 6 (six) hours as needed. 120 tablet 0  . pregabalin  (LYRICA) 100 MG capsule Take 1 capsule (100 mg total) by mouth 2 (two) times daily. (Patient not taking: Reported on 11/26/2015) 60 capsule 2   No current facility-administered medications for this visit.   Family History  Problem Relation Age of Onset  . Hypertension Mother   . Alcohol abuse Mother   . Epilepsy Daughter    Social History   Social History  . Marital Status: Single    Spouse Name: N/A  . Number of Children: N/A  . Years of Education: N/A   Social History Main Topics  . Smoking status: Never Smoker   . Smokeless tobacco: Never Used  . Alcohol Use: No  . Drug Use: No  . Sexual Activity: Yes    Birth Control/ Protection: None   Other Topics Concern  . None   Social History Narrative   Review of Systems: Review of Systems  Constitutional: Negative for fever, chills and weight loss.  Respiratory: Negative for shortness of breath.   Cardiovascular: Negative for chest pain.  Musculoskeletal: Positive for back pain.  Neurological: Negative for seizures.    Objective:  Physical Exam: Filed Vitals:   11/26/15 1051  BP: 139/89  Pulse: 102  Temp: 98 F (36.7 C)  TempSrc: Oral  Weight: 121 lb 12.8 oz (55.248 kg)  SpO2: 100%  Physical Exam  Constitutional: She is well-developed, well-nourished, and in no distress.  Cardiovascular: Normal rate and regular rhythm.   Pulmonary/Chest: Effort normal and breath sounds normal.  Musculoskeletal:       Lumbar back: She exhibits tenderness (paraspinal). She exhibits no bony tenderness.  Neurological:  Ambulates with cain  Nursing note and vitals reviewed.   Assessment & Plan:  Case discussed with Dr. Cyndie Chime  Anxiety HPI: She has previously been on sertraline.  She does report that her anxiety is uncontrolled and would reconsider starting back a similar medication.  A: Generalized Anxiety disorder  P: Given her low back pain and potential for treatment of two conditions I will start her on Duloxetine  30mg  daily x 7 days then 60mg  daily.  -Would like to work to get off Tramadol, warned of potential for seratonin syndrome, and advised on signs of symptoms.  Back pain with left-sided radiculopathy HPI: She has chronic low back pain and has had chronic narcotic therapy.  She takes Oxycodone 10mg  reguarlly every 6 hours and is prescribed 120 pills a month.  Occaisonally she uses Tramadol but reports that it is not effective.  She has previously had a prescription of Lyrica but cannot afford this due to forumlary changes, Gabapentin was not effective.  She last had a pain contract signed in 2014 with Dr Manson Passey.  Unfortuantely she has no showed to her last 2 appointments and was told she needed to come in today for a refill.   She reports that the oxycodone does not relieve her pain completely but helps her to be able to tolerate minimal activity required for her daily activities.  A: Chronic back pain  P: - Obtain UDS to verify presence of Oxycodone that she reports was last taken this morning.  Tramadol was last taken 3 days ago. - Refill Oxycodone 10mg  Q6PRN per previous pain contract #120. I have printed a second script that she can pick up in 1 month pending evaluation of UDS. -  Recommended she use tramadol sparingly and would recommend eventually d/c of this medication due to her seziure disorder especially since it is not effective. - She is prescribed , I have discussed and provided her a Rx for Naloxone - We have made her a follow up appointment with her PCP in 6 weeks (first available) I have told her she MUST attend this appointment and sign a new (updated) pain contract with her PCP.  Long term prescription opiate use - She is Rx 60 MME daily, discussed Naloxones role in OD today and provided Rx.    Medications Ordered Meds ordered this encounter  Medications  . Naloxone HCl (EVZIO) 0.4 MG/0.4ML SOAJ    Sig: Inject 0.4 mg as directed as needed.    Dispense:  1 Package     Refill:  1  . DISCONTD: Oxycodone HCl 10 MG TABS    Sig: Take 1 tablet (10 mg total) by mouth every 6 (six) hours as needed.    Dispense:  120 tablet    Refill:  0    Rx 1/2, Please fill 30 days after last prescription.  . Oxycodone HCl 10 MG TABS    Sig: Take 1 tablet (10 mg total) by mouth every 6 (six) hours as needed.    Dispense:  120 tablet    Refill:  0    Rx 2/2, Please fill 30 days after last prescription.  Marland Kitchen  DULoxetine (CYMBALTA) 30 MG capsule    Sig: 30 mg daily for 1 week then increase to  daily.    Dispense:  60 capsule    Refill:  2   Other Orders Orders Placed This Encounter  Procedures  . Tox[employer]ure Select,+Antidepr,UR   Follow Up: Return in about 6 weeks (around 01/07/2016).

## 2015-11-26 NOTE — Progress Notes (Signed)
Medicine attending: Medical history, presenting problems, physical findings, and medications, reviewed with resident physician Dr Erik Hoffman on the day of the patient visit and I concur with his evaluation and management plan. 

## 2015-11-26 NOTE — Assessment & Plan Note (Signed)
-   She is Rx 60 MME daily, discussed Naloxones role in OD today and provided Rx.

## 2015-11-26 NOTE — Patient Instructions (Signed)
General Instructions:  If you have trouble obtaining Narcan I want you to call and let Dr Marzetta Board at our clinic know, she may know of resources to help.  Please bring your medicines with you each time you come to clinic.  Medicines may include prescription medications, over-the-counter medications, herbal remedies, eye drops, vitamins, or other pills.   Progress Toward Treatment Goals:  Treatment Goal 04/03/2014  Prevent falls at goal    Self Care Goals & Plans:  Self Care Goal 02/01/2015  Manage my medications take my medicines as prescribed; bring my medications to every visit; refill my medications on time  Eat healthy foods drink diet soda or water instead of juice or soda; eat more vegetables; eat foods that are low in salt; eat baked foods instead of fried foods  Be physically active -  Meeting treatment goals -    No flowsheet data found.   Care Management & Community Referrals:  Referral 04/03/2014  Referrals made for care management support none needed  Referrals made to community resources none

## 2015-11-26 NOTE — Assessment & Plan Note (Signed)
HPI: She has previously been on sertraline.  She does report that her anxiety is uncontrolled and would reconsider starting back a similar medication.  A: Generalized Anxiety disorder  P: Given her low back pain and potential for treatment of two conditions I will start her on Duloxetine  daily x 7 days then  daily.  -Would like to work to get off Tramadol, warned of potential for seratonin syndrome, and advised on signs of symptoms.

## 2015-12-03 LAB — TOXASSURE SELECT,+ANTIDEPR,UR: PDF: 0

## 2015-12-27 ENCOUNTER — Other Ambulatory Visit: Payer: Self-pay | Admitting: Internal Medicine

## 2016-01-03 NOTE — Telephone Encounter (Signed)
Phoned into [facility]

## 2016-01-06 ENCOUNTER — Telehealth: Payer: Self-pay | Admitting: Internal Medicine

## 2016-01-06 NOTE — Telephone Encounter (Signed)
APPT. REMINDER CALL, LMTCB °

## 2016-01-07 ENCOUNTER — Ambulatory Visit (INDEPENDENT_AMBULATORY_CARE_PROVIDER_SITE_OTHER): Payer: Medicare Other | Admitting: Internal Medicine

## 2016-01-07 ENCOUNTER — Encounter: Payer: Self-pay | Admitting: Internal Medicine

## 2016-01-07 VITALS — BP 147/74 | HR 85 | Temp 98.3°F | Ht 59.0 in | Wt 115.1 lb

## 2016-01-07 DIAGNOSIS — Z79891 Long term (current) use of opiate analgesic: Secondary | ICD-10-CM

## 2016-01-07 DIAGNOSIS — M5416 Radiculopathy, lumbar region: Secondary | ICD-10-CM | POA: Diagnosis not present

## 2016-01-07 DIAGNOSIS — F411 Generalized anxiety disorder: Secondary | ICD-10-CM | POA: Diagnosis not present

## 2016-01-07 DIAGNOSIS — G8929 Other chronic pain: Secondary | ICD-10-CM | POA: Diagnosis not present

## 2016-01-07 DIAGNOSIS — F419 Anxiety disorder, unspecified: Secondary | ICD-10-CM

## 2016-01-07 DIAGNOSIS — M5410 Radiculopathy, site unspecified: Secondary | ICD-10-CM

## 2016-01-07 NOTE — Patient Instructions (Addendum)
Start taking duloxetine (Cymbalta) 30mg  daily for 1 week then 60mg  daily. This medicine can take up to 6 weeks to achieve its full benefit so you should continue taking it even if there is no immediate effect.  We discontinued tramadol today due to little benefit and potential harms.

## 2016-01-07 NOTE — Progress Notes (Signed)
Subjective:   Patient ID: Victoria Ellis female   DOB: 24-Jul-1976 39 y.o.   MRN: [id number]  HPI: Victoria Ellis is a 40 y.o. woman with a past medical history as detailed below presenting for follow up of her chronic lumbosacral radiculopathy on long term opiate pain medication and anxiety. She was last seen a month ago by Dr. Mikey BussingHoffman and overall feeling well since that time. She fell one time she attributes to left foot drop without significant injury. She has discontinued used of tramadol without noticing much change in her back pain. She reports not taking duloxetine yet because she just filled the prescription after having her medicaid prescription coverage temporarily lapsed.  See problem based assessment and plan below for additional details.  Past Medical History  Diagnosis Date  . Chronic back pain 2000      1.  Postoperative changes left hemilaminotomy L5-S1.  Marland Kitchen. Foot drop     left foot  . Abnormal Pap smear   . Nerve damage     bilateral legs   . Complication of anesthesia     11/15 while having tooth extraction woke up.  . Seizures (HCC)     10/06/14- over 6 months ago- "maybe antidepressant- Zoloft.  . Asthma   . Anxiety   . Depression   . GERD (gastroesophageal reflux disease)   . Constipation    Current Outpatient Prescriptions  Medication Sig Dispense Refill  . albuterol (PROVENTIL HFA;VENTOLIN HFA) 108 (90 BASE) MCG/ACT inhaler Inhale 2 puffs into the lungs 3 (three) times daily as needed for wheezing or shortness of breath.    . diphenhydrAMINE (BENADRYL) 25 MG tablet Take 25 mg by mouth at bedtime as needed for sleep. Reported on 11/26/2015    . DULoxetine (CYMBALTA) 30 MG capsule 30 mg daily for 1 week then increase to 60mg  daily. 60 capsule 2  . levETIRAcetam (KEPPRA) 500 MG tablet Take 1 tablet (500 mg total) by mouth 2 (two) times daily. 60 tablet 5  . Naloxone HCl (EVZIO) 0.4 MG/0.4ML SOAJ Inject 0.4 mg as directed as needed. (Patient not taking: Reported  on 11/26/2015) 1 Package 1  . Oxycodone HCl 10 MG TABS Take 1 tablet (10 mg total) by mouth every 6 (six) hours as needed. 120 tablet 0  . pregabalin (LYRICA) 100 MG capsule Take 1 capsule (100 mg total) by mouth 2 (two) times daily. (Patient not taking: Reported on 11/26/2015) 60 capsule 2  . traMADol (ULTRAM) 50 MG tablet Take 1 tablet (50 mg total) by mouth 3 (three) times daily as needed for moderate pain or severe pain. 90 tablet 2  . zolpidem (AMBIEN) 5 MG tablet TAKE 1 TABLET BY MOUTH EVERY DAY AT BEDTIME AS NEEDED FOR SLEEP 30 tablet 0   No current facility-administered medications for this visit.   Family History  Problem Relation Age of Onset  . Hypertension Mother   . Alcohol abuse Mother   . Epilepsy Daughter    Social History   Social History  . Marital Status: Single    Spouse Name: N/A  . Number of Children: N/A  . Years of Education: N/A   Social History Main Topics  . Smoking status: Never Smoker   . Smokeless tobacco: Never Used  . Alcohol Use: No  . Drug Use: No  . Sexual Activity: Yes    Birth Control/ Protection: None   Other Topics Concern  . None   Social History Narrative   Review of Systems: Review  of Systems  Constitutional: Negative for fever and chills.  Eyes: Negative for blurred vision and double vision.  Respiratory: Negative for shortness of breath.   Cardiovascular: Negative for chest pain.  Gastrointestinal: Negative for constipation.  Genitourinary: Negative for dysuria.  Musculoskeletal: Positive for joint pain and falls.  Skin: Negative for rash.  Neurological: Positive for focal weakness and weakness. Negative for headaches.  Endo/Heme/Allergies: Does not bruise/bleed easily.  Psychiatric/Behavioral: The patient is nervous/anxious and has insomnia.     Objective:  Physical Exam: Filed Vitals:   01/07/16 1423  BP: 147/74  Pulse: 85  Temp: 98.3 F (36.8 C)  TempSrc: Oral  Height:  (1.499 m)  Weight: 115 lb 1.6 oz (52.209  kg)  SpO2: 100%   GENERAL- alert, co-operative, NAD HEENT- Atraumatic CARDIAC- RRR, no murmurs, rubs or gallops. RESP- CTAB, no wheezes or crackles. BACK- Paraspinal tenderness, pain on straight leg raise NEURO- LLE strength 3/5 on hip flexion and knee extension, 1/5 on foot dorsiflexion, RLE 5/5 strength throughout EXTREMITIES- pulse 2+, distal LE muscle bulk L<R SKIN- Warm, dry, No rash or lesion. PSYCH- Normal mood and affect, appropriate thought content and speech.  Assessment & Plan:

## 2016-01-09 NOTE — Assessment & Plan Note (Signed)
On stable dose of oxycodone for pain control after failed improvement with multiple surgeries and physical therapy. UDS from 11/26/15 showing appropriate contents. Updated pain contract and discussed goals of therapy today, copy provided to patient.

## 2016-01-09 NOTE — Assessment & Plan Note (Addendum)
A: Chronic back pain with associated foot weakness and gait abnormality. On narcotics for pain control. Since last visit she has discontinued tramadol due to concern of worsening seizure disorder. Now on only oxycodone 40mg /day. No significant worsening of her symptoms without the tramadol. UDS from 11/2014 showed appropriate results.  P: -New pain contract signed today. We discussed some of her goals for treatment specifically including adequate pain relief to interact easily with her 4 children, perform house chores, and participate at church. -Recommended she really try duloxetine as it may also offer some benefit since her radicular symptoms are prominent and may benefit -Will continue to refill meds as needed, plan F/U at 2 months

## 2016-01-09 NOTE — Assessment & Plan Note (Addendum)
A: Uncontrolled generalized anxiety disorder, has not started duloxetine treatment yet but is amenable to the recommendation. She is no longer on tramadol. Reports her anxiety is so severe now that she does not attend church regularly anymore.  P: -Recommended she try taking duloxetine for at least several weeks before deciding efficacy, contact clinic if she is discontinuing earlier due to side effect/intolerance -Consider referral to therapy as alternative or synergistic approach with medication if anxiety remains severe

## 2016-01-10 NOTE — Progress Notes (Signed)
Internal Medicine Clinic Attending  Case discussed with Dr. Rice at the time of the visit.  We reviewed the resident's history and exam and pertinent patient test results.  I agree with the assessment, diagnosis, and plan of care documented in the resident's note.  

## 2016-01-10 NOTE — Addendum Note (Signed)
Addended by: Fuller PlanICE, Nichol Ator W on: 01/10/2016 03:49 PM   Modules accepted: Level of Service

## 2016-01-24 ENCOUNTER — Other Ambulatory Visit: Payer: Self-pay | Admitting: Internal Medicine

## 2016-01-24 ENCOUNTER — Other Ambulatory Visit: Payer: Self-pay | Admitting: *Deleted

## 2016-01-24 MED ORDER — OXYCODONE HCL 10 MG PO TABS: 10.0000 mg | ORAL_TABLET | Freq: Four times a day (QID) | ORAL | Status: DC | PRN

## 2016-01-24 NOTE — Telephone Encounter (Signed)
Last refill 2/03 at visit w/ 2 scripts Next visit none scheduled, last appt 3/17 Last uds2/3

## 2016-01-26 ENCOUNTER — Encounter: Payer: Self-pay | Admitting: Internal Medicine

## 2016-01-29 DIAGNOSIS — R220 Localized swelling, mass and lump, head: Secondary | ICD-10-CM | POA: Diagnosis not present

## 2016-01-29 DIAGNOSIS — Z79899 Other long term (current) drug therapy: Secondary | ICD-10-CM | POA: Diagnosis not present

## 2016-01-29 DIAGNOSIS — R42 Dizziness and giddiness: Secondary | ICD-10-CM | POA: Diagnosis not present

## 2016-01-29 DIAGNOSIS — H538 Other visual disturbances: Secondary | ICD-10-CM | POA: Diagnosis not present

## 2016-01-29 DIAGNOSIS — R569 Unspecified convulsions: Secondary | ICD-10-CM | POA: Diagnosis not present

## 2016-01-29 DIAGNOSIS — L0293 Carbuncle, unspecified: Secondary | ICD-10-CM | POA: Diagnosis not present

## 2016-01-29 DIAGNOSIS — L0203 Carbuncle of face: Secondary | ICD-10-CM | POA: Diagnosis not present

## 2016-01-29 DIAGNOSIS — G4700 Insomnia, unspecified: Secondary | ICD-10-CM | POA: Diagnosis not present

## 2016-02-04 ENCOUNTER — Other Ambulatory Visit: Payer: Self-pay | Admitting: Internal Medicine

## 2016-02-07 ENCOUNTER — Other Ambulatory Visit: Payer: Self-pay | Admitting: *Deleted

## 2016-02-08 ENCOUNTER — Other Ambulatory Visit: Payer: Self-pay | Admitting: Internal Medicine

## 2016-02-08 MED ORDER — ZOLPIDEM TARTRATE 5 MG PO TABS: 5.0000 mg | ORAL_TABLET | Freq: Every evening | ORAL | Status: DC | PRN

## 2016-02-08 NOTE — Telephone Encounter (Signed)
Prescription with 1 refill to cover her until scheduled appointment on 04/21/16. I have some concerns about her health recent and on her controlled substances I will not refill it again until she is seen.  Please call in.

## 2016-02-08 NOTE — Telephone Encounter (Signed)
Called into pharmacy

## 2016-02-18 ENCOUNTER — Encounter: Payer: Medicaid Other | Admitting: Internal Medicine

## 2016-02-20 ENCOUNTER — Other Ambulatory Visit: Payer: Self-pay | Admitting: Internal Medicine

## 2016-02-21 NOTE — Telephone Encounter (Signed)
Pt picked up script 4/4 Next appt 6/8 Last uds 2/3

## 2016-02-22 MED ORDER — OXYCODONE HCL 10 MG PO TABS: 10.0000 mg | ORAL_TABLET | Freq: Four times a day (QID) | ORAL | Status: DC | PRN

## 2016-03-20 ENCOUNTER — Other Ambulatory Visit: Payer: Self-pay | Admitting: Internal Medicine

## 2016-03-21 ENCOUNTER — Encounter: Payer: Self-pay | Admitting: *Deleted

## 2016-03-21 MED ORDER — OXYCODONE HCL 10 MG PO TABS: 10.0000 mg | ORAL_TABLET | Freq: Four times a day (QID) | ORAL | Status: DC | PRN

## 2016-03-21 NOTE — Telephone Encounter (Signed)
Pt sent mychart message

## 2016-03-21 NOTE — Telephone Encounter (Signed)
1 refill printed today, next refills can be provided at appointment (6/30)

## 2016-04-21 ENCOUNTER — Encounter: Payer: Self-pay | Admitting: Internal Medicine

## 2016-04-21 ENCOUNTER — Ambulatory Visit (INDEPENDENT_AMBULATORY_CARE_PROVIDER_SITE_OTHER): Payer: Medicare Other | Admitting: Internal Medicine

## 2016-04-21 ENCOUNTER — Other Ambulatory Visit: Payer: Self-pay | Admitting: Internal Medicine

## 2016-04-21 VITALS — BP 139/78 | HR 84 | Temp 97.5°F | Ht 59.0 in | Wt 115.0 lb

## 2016-04-21 DIAGNOSIS — G8929 Other chronic pain: Secondary | ICD-10-CM

## 2016-04-21 DIAGNOSIS — M5416 Radiculopathy, lumbar region: Secondary | ICD-10-CM | POA: Diagnosis not present

## 2016-04-21 DIAGNOSIS — M21372 Foot drop, left foot: Secondary | ICD-10-CM

## 2016-04-21 DIAGNOSIS — M5410 Radiculopathy, site unspecified: Secondary | ICD-10-CM

## 2016-04-21 DIAGNOSIS — F411 Generalized anxiety disorder: Secondary | ICD-10-CM | POA: Diagnosis not present

## 2016-04-21 DIAGNOSIS — F419 Anxiety disorder, unspecified: Secondary | ICD-10-CM

## 2016-04-21 MED ORDER — CITALOPRAM HYDROBROMIDE 10 MG PO TABS: 10.0000 mg | ORAL_TABLET | Freq: Every day | ORAL | Status: DC

## 2016-04-21 MED ORDER — OXYCODONE HCL 10 MG PO TABS: 10.0000 mg | ORAL_TABLET | Freq: Four times a day (QID) | ORAL | Status: DC | PRN

## 2016-04-21 MED ORDER — ZOLPIDEM TARTRATE 5 MG PO TABS: 5.0000 mg | ORAL_TABLET | Freq: Every evening | ORAL | Status: DC | PRN

## 2016-04-21 NOTE — Patient Instructions (Signed)
I am glad to hear you are challenging yourself with anxiety by staying active outdoors and participating with your parish.  I would like for you to try taking a low dose anti anxiety medicines once daily, Celexa. This is the same type of medication you took in the past but I think it may be of at least a partial benefit to your symptoms. I would like to follow up in about 2 months if possible to ask if you can notice any improvement. If you have adverse effects before this time please let us know before then.

## 2016-04-23 NOTE — Assessment & Plan Note (Signed)
A: She had one fall that was described to be associated with her left foot drop. She has not had multiple similar events lately, and no treatments required or evidence of injury on examination today. Physical exam consistent with decreases distal LLE strength, reflexes, muscle bulk.  P: -Consider referral for foot orthotics if mechanical fall recurs despite improving activity level and some progress weaning her sedating medications

## 2016-04-23 NOTE — Progress Notes (Signed)
   CC: Follow up for chronic left leg radiculopathy and generalized anxiety HPI: [name] is a 40 y.o. woman presenting to clinic for 3 month follow up of her chronic left lower back pain and radiculopathy and her generalized anxiety disorder. Her pain has been adequately controlled on oxycodone 3-4 times daily. Her radiculopathy is slightly more noticeable since discontinuing tramadol but is not activity limiting. She had had one fall over the interval from tripping entering her home she attributes to her left foot catching on the doorframe. Her anxiety continues to be problematic. She was unable to tolerate starting cymbalta as recommended due to feeling overly sedated from this medicine and breaking out into a rash on her face. Her symptoms resolved after stopping this medication. 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  Please see problem based assessment and plan below for additional details.   Past Medical History  Diagnosis Date  . Chronic back pain 2000      1.  Postoperative changes left hemilaminotomy L5-S1.  Marland Kitchen. Foot drop     left foot  . Abnormal Pap smear   . Nerve damage     bilateral legs   . Complication of anesthesia     11/15 while having tooth extraction woke up.  . Seizures (HCC)     10/06/14- over 6 months ago- "maybe antidepressant- Zoloft.  . Asthma   . Anxiety   . Depression   . GERD (gastroesophageal reflux disease)   . Constipation    Review of Systems: Review of Systems  Respiratory: Negative for shortness of breath.   Musculoskeletal: Positive for back pain and falls.  Skin: Positive for rash.  Neurological: Positive for headaches. Negative for seizures.  Psychiatric/Behavioral: The patient is nervous/anxious.     Physical Exam: Filed Vitals:   04/21/16 1639  BP: 139/78  Pulse: 84  Temp: 97.5 F (36.4 C)  TempSrc: Oral  Height: 4\' 11"  (1.499 m)  Weight:  115 lb (52.164 kg)  SpO2: 100%   GENERAL- alert, co-operative, NAD BACK- Paraspinal tenderness, pain on straight leg raise NEURO- LLE strength 1/5 on foot dorsiflexion EXTREMITIES- pulse 2+, distal LE muscle bulk L<R SKIN- Pink pruritic rashes present on lower extremities b/l, small <1cm rash present right eyebrow PSYCH- Anxious, normal speech and appropriate thought content  Assessment & Plan:  See encounters tab for problem based medical decision making. Patient discussed with Dr. Rogelia BogaButcher

## 2016-04-23 NOTE — Assessment & Plan Note (Signed)
A: She continues to have fairly severe generalized anxiety disorder symptoms. She was unfortunately not able to tolerate starting duloxetine due to side effects. She reports her poison ivy rash on legs today as a consequence of exercising in the park only at night due to anxiety. She has made some progress with church participation and is now active weekly as a Nurse, adultyouth mentor.  P: -Start celexa at 10mg  dose, to be titrated up to 20mg  if tolerating -F/U at approximately 6 weeks

## 2016-04-23 NOTE — Assessment & Plan Note (Signed)
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 She has tolerated the discontinuation of tramadol well. Unfortunately cymbalta was not tolerated due to rash as this may have also been a good medication for her radiculopathy.   P: -Refill oxycodone #120 tablets x3 months

## 2016-04-24 NOTE — Progress Notes (Signed)
Internal Medicine Clinic Attending  Case discussed with Dr. Rice at the time of the visit.  We reviewed the resident's history and exam and pertinent patient test results.  I agree with the assessment, diagnosis, and plan of care documented in the resident's note.  

## 2016-07-14 ENCOUNTER — Other Ambulatory Visit: Payer: Self-pay | Admitting: Internal Medicine

## 2016-07-14 DIAGNOSIS — M5410 Radiculopathy, site unspecified: Secondary | ICD-10-CM

## 2016-07-14 DIAGNOSIS — R45851 Suicidal ideations: Secondary | ICD-10-CM | POA: Insufficient documentation

## 2016-07-14 NOTE — Telephone Encounter (Signed)
last refill 06/20/16. Last appt. 04/21/16. UDS 05/30/16.

## 2016-07-17 ENCOUNTER — Encounter: Payer: Self-pay | Admitting: *Deleted

## 2016-07-17 ENCOUNTER — Other Ambulatory Visit: Payer: Self-pay | Admitting: *Deleted

## 2016-07-17 DIAGNOSIS — M5410 Radiculopathy, site unspecified: Secondary | ICD-10-CM

## 2016-07-17 MED ORDER — OXYCODONE HCL 10 MG PO TABS: 10.0000 mg | ORAL_TABLET | Freq: Four times a day (QID) | ORAL | 0 refills | Status: DC | PRN

## 2016-07-17 NOTE — Telephone Encounter (Signed)
Ms. Victoria Ellis needs a new appointment when possible, she is now 3 months since her previous. I guess contact her by email since she is reporting no reliably phone contact at this time? I am refilling her prescription x1 additional time but she will need to be seen before others.

## 2016-07-17 NOTE — Telephone Encounter (Signed)
Pt has an appt 09/29/16.

## 2016-08-10 IMAGING — CR DG LUMBAR SPINE 2-3V
1 series · 1 of 1 positions shown · non-contrast
Comparison: Lumbar spine MRI dated 03/28/2013

CLINICAL DATA: Microdiskectomy L5-S1

EXAM:
LUMBAR SPINE - 2-3 VIEW

[lateral]
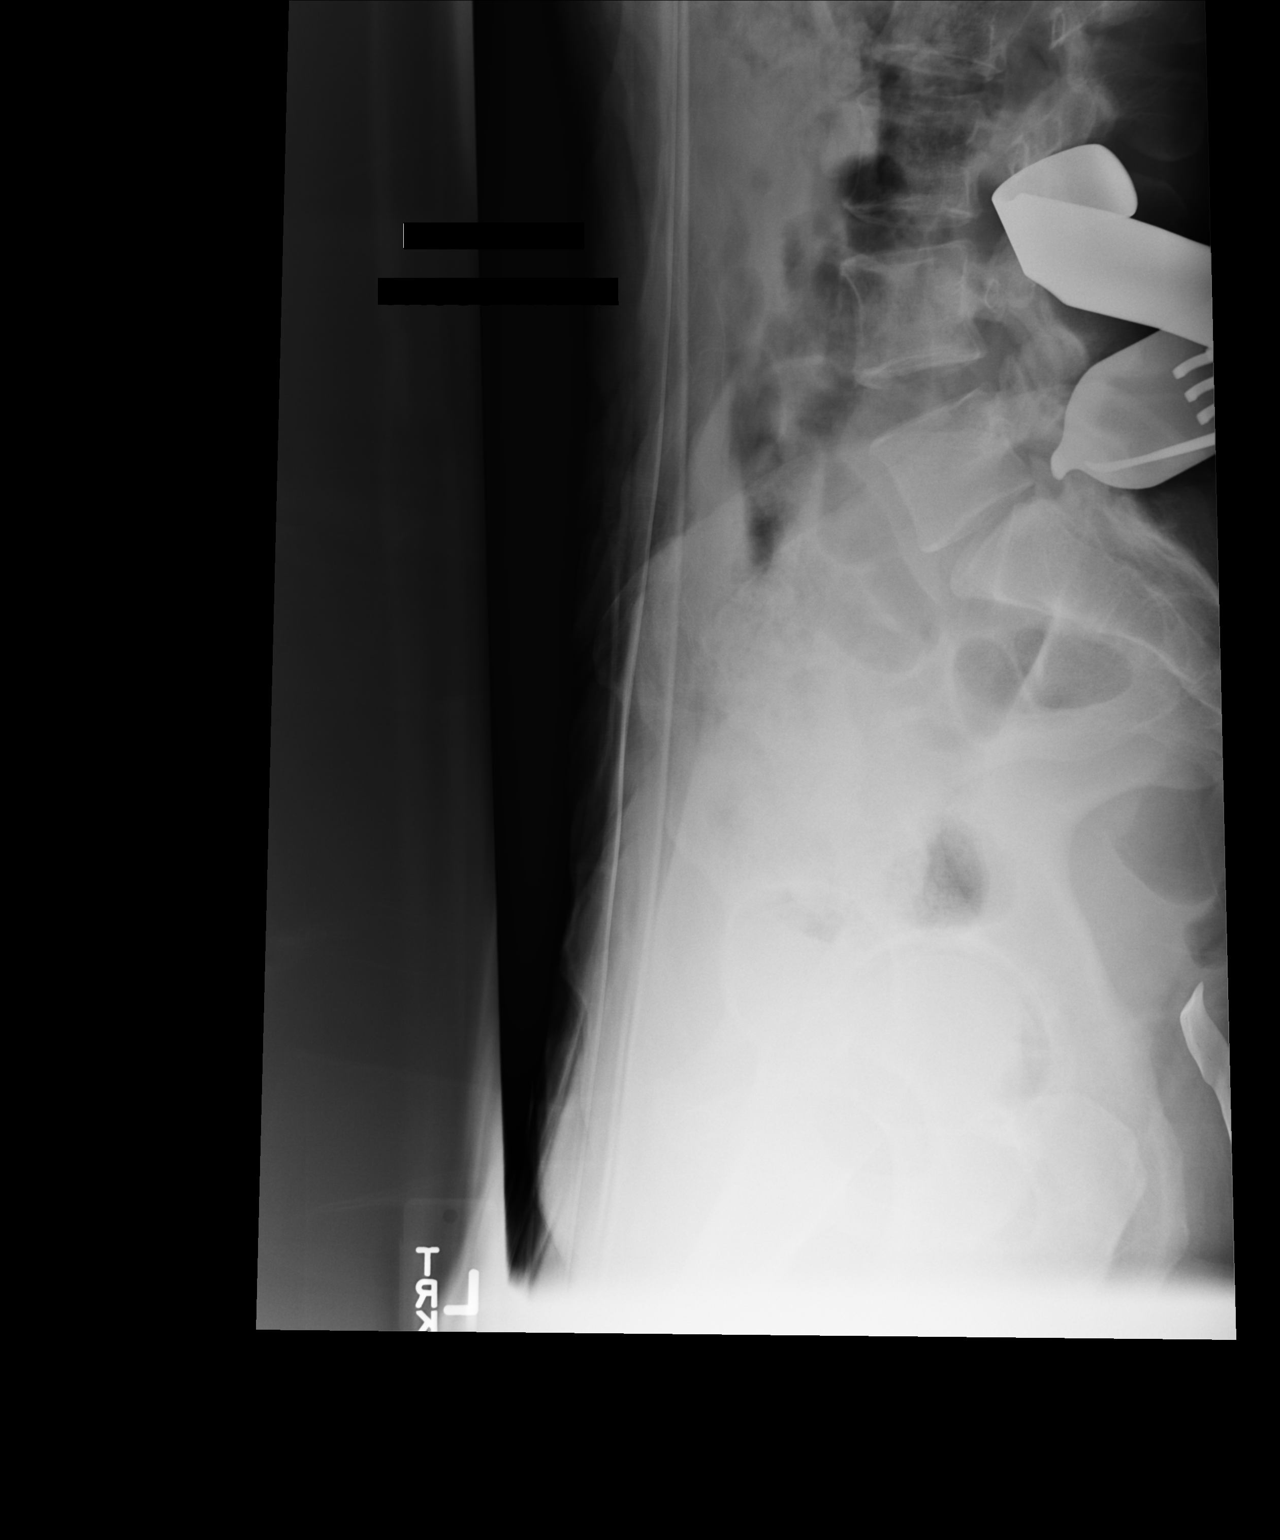

[1 of 1 positions shown; findings below may reference images not displayed]

FINDINGS: Single lateral view demonstrates surgical hardware at L5-S1.

Additional surgical hardware posterior to L3-4.
IMPRESSION: Lumbar levels as above.

## 2016-08-11 ENCOUNTER — Telehealth: Payer: Self-pay | Admitting: Internal Medicine

## 2016-08-11 NOTE — Telephone Encounter (Signed)
APT. REMINDER CALL, NO ANSWER, NO VOICEMAIL °

## 2016-08-14 ENCOUNTER — Ambulatory Visit (INDEPENDENT_AMBULATORY_CARE_PROVIDER_SITE_OTHER): Payer: Medicare Other | Admitting: Internal Medicine

## 2016-08-14 VITALS — BP 133/82 | HR 83 | Temp 98.0°F | Wt 118.1 lb

## 2016-08-14 DIAGNOSIS — Z79891 Long term (current) use of opiate analgesic: Secondary | ICD-10-CM | POA: Diagnosis not present

## 2016-08-14 DIAGNOSIS — Z0000 Encounter for general adult medical examination without abnormal findings: Secondary | ICD-10-CM | POA: Insufficient documentation

## 2016-08-14 DIAGNOSIS — G8929 Other chronic pain: Secondary | ICD-10-CM

## 2016-08-14 DIAGNOSIS — M5416 Radiculopathy, lumbar region: Secondary | ICD-10-CM

## 2016-08-14 DIAGNOSIS — Z9181 History of falling: Secondary | ICD-10-CM | POA: Diagnosis not present

## 2016-08-14 DIAGNOSIS — M5410 Radiculopathy, site unspecified: Secondary | ICD-10-CM

## 2016-08-14 MED ORDER — GABAPENTIN 600 MG PO TABS: 600.0000 mg | ORAL_TABLET | Freq: Two times a day (BID) | ORAL | 1 refills | Status: DC

## 2016-08-14 MED ORDER — OXYCODONE HCL 10 MG PO TABS: 10.0000 mg | ORAL_TABLET | Freq: Four times a day (QID) | ORAL | 0 refills | Status: DC | PRN

## 2016-08-14 NOTE — Assessment & Plan Note (Signed)
Patient declined flu shot today.

## 2016-08-14 NOTE — Progress Notes (Signed)
Internal Medicine Clinic Attending  Case discussed with Dr. Patel,Vishal at the time of the visit.  We reviewed the resident's history and exam and pertinent patient test results.  I agree with the assessment, diagnosis, and plan of care documented in the resident's note.  

## 2016-08-14 NOTE — Patient Instructions (Signed)
It was a pleasure to meet you Victoria Ellis.  I have refilled your pain medication today for 1 month supply.  I have also sent a prescription for Gabapentin 600 mg twice daily to help with your pain.  Please try to use your cane as much as possible to help avoid further falls.   Follow up with Dr. Dimple Caseyice on 09/29/16.

## 2016-08-14 NOTE — Assessment & Plan Note (Signed)
Patient with chronic low back pain with radiculopathy greatest on LLE. She is s/p L5-S1 discectomy. She is chronic narcotics for pain. Currently takes Oxycodone 10 mg q6h prn #120/month and states she needs to use this four times a day for pain. Last filled on 07/18/16. She uses a cane to assist with ambulation. She does have occasional falls, about 1 per month, when she attempts to walk outside without her cane. Last fall was 2 weeks ago without injury. She reports trying multiple OTC medications, patches, creams/gels, heat, and ice without relief of her pain between her pain medication doses. She reports Gabapentin has helped before.  Pain is currently fairly controlled. Will refill her Oxycodone for 1 month supply as PCP is currently unavailable. [facility] database is reviewed and appropriate. Will also prescribe a retrial of Gabapentin as patient reports some relief with this in the past. -Refilled Oxycodone 10 mg q6h prn #120/month for 1 month supply; Rx printed, signed, and handed to patient -Start Gabapentin 600 mg BID -follow up with PCP

## 2016-08-14 NOTE — Progress Notes (Signed)
   CC: Back pain  HPI:  [name] is a 40 y.o. female with PMH as listed below who presents for follow up management of her low back pain.  Patient with chronic low back pain with radiculopathy greatest on LLE. She is s/p L5-S1 discectomy. She is chronic narcotics for pain. Currently takes Oxycodone 10 mg q6h prn #120/month and states she needs to use this four times a day for pain. Last filled on 07/18/16. She uses a cane to assist with ambulation. She does have occasional falls, about 1 per month, when she attempts to walk outside without her cane. Last fall was 2 weeks ago without injury. She reports trying multiple OTC medications, patches, creams/gels, heat, and ice without relief of her pain between her pain medication doses. She reports Gabapentin has helped before.   Past Medical History:  Diagnosis Date  . Abnormal Pap smear   . Anxiety   . Asthma   . Chronic back pain 2000     1.  Postoperative changes left hemilaminotomy L5-S1.  Marland Kitchen. Complication of anesthesia    11/15 while having tooth extraction woke up.  . Constipation   . Depression   . Foot drop    left foot  . GERD (gastroesophageal reflux disease)   . Nerve damage    bilateral legs   . Seizures (HCC)    10/06/14- over 6 months ago- "maybe antidepressant- Zoloft.    Review of Systems:   Review of Systems  Respiratory: Negative for shortness of breath.   Cardiovascular: Negative for chest pain.  Genitourinary:       No bowel/bladder incontinence  Musculoskeletal: Positive for back pain and falls.  Neurological: Positive for tremors. Negative for dizziness, tingling and loss of consciousness.       Chronic back pain radiating to legs (L > R) and left foot drop. No sensory change or new/worsening weakness. Chronic tremor of legs.     Physical Exam:  Vitals:   08/14/16 1023  BP: 133/82  Pulse: 83  Temp: 98 F (36.7 C)  TempSrc: Oral  SpO2: 100%  Weight: 118 lb 1.6 oz (53.6 kg)   Physical Exam    Constitutional: She is oriented to person, place, and time.  Cardiovascular: Normal rate and regular rhythm.   Pulmonary/Chest: Effort normal. No respiratory distress. She has no wheezes. She has no rales.  Musculoskeletal:  Diminished dorsiflexion on left foot 2/5, 4/5 on right foot. Pain elicited with minimal elevation on straight leg test bilaterally. ROM diminished with extension of LLE at the knee due to pain.   Neurological: She is alert and oriented to person, place, and time.  Legs tremulous at rest    Assessment & Plan:   See Encounters Tab for problem based charting.  Patient discussed with Dr. Cleda DaubE. Hoffman

## 2016-08-21 ENCOUNTER — Encounter: Payer: Self-pay | Admitting: Internal Medicine

## 2016-08-22 ENCOUNTER — Other Ambulatory Visit: Payer: Self-pay | Admitting: *Deleted

## 2016-08-22 ENCOUNTER — Other Ambulatory Visit: Payer: Self-pay | Admitting: Internal Medicine

## 2016-08-22 DIAGNOSIS — F419 Anxiety disorder, unspecified: Secondary | ICD-10-CM

## 2016-08-22 NOTE — Telephone Encounter (Signed)
Also received faxed request for pt's levetiracetam 500mg  tab -request sent to pcp.Kingsley SpittleGoldston, Leisha Trinkle Cassady10/31/20174:37 PM

## 2016-08-24 ENCOUNTER — Other Ambulatory Visit: Payer: Self-pay | Admitting: Internal Medicine

## 2016-08-24 DIAGNOSIS — F419 Anxiety disorder, unspecified: Secondary | ICD-10-CM

## 2016-08-24 MED ORDER — LEVETIRACETAM 500 MG PO TABS: 500.0000 mg | ORAL_TABLET | Freq: Two times a day (BID) | ORAL | 5 refills | Status: AC

## 2016-08-24 MED ORDER — ZOLPIDEM TARTRATE 5 MG PO TABS: 5.0000 mg | ORAL_TABLET | Freq: Every evening | ORAL | 0 refills | Status: DC | PRN

## 2016-08-24 NOTE — Telephone Encounter (Signed)
CALLED TO PHARM.

## 2016-08-24 NOTE — Telephone Encounter (Signed)
Both rxs phoned into pharmacy.Kingsley SpittleGoldston, Priscille Shadduck Cassady11/2/20174:41 PM

## 2016-08-28 ENCOUNTER — Ambulatory Visit: Payer: Medicare Other

## 2016-09-04 ENCOUNTER — Other Ambulatory Visit: Payer: Self-pay | Admitting: Internal Medicine

## 2016-09-04 DIAGNOSIS — M5410 Radiculopathy, site unspecified: Secondary | ICD-10-CM

## 2016-09-06 ENCOUNTER — Encounter: Payer: Self-pay | Admitting: *Deleted

## 2016-09-06 MED ORDER — OXYCODONE HCL 10 MG PO TABS: 10.0000 mg | ORAL_TABLET | Freq: Four times a day (QID) | ORAL | 0 refills | Status: DC | PRN

## 2016-09-06 NOTE — Telephone Encounter (Signed)
1 refill printed, next appointment appears to be on 09/29/16 before end date.

## 2016-09-28 ENCOUNTER — Telehealth: Payer: Self-pay | Admitting: Internal Medicine

## 2016-09-28 NOTE — Telephone Encounter (Signed)
APT. REMINDER CALL, LMTCB °

## 2016-09-29 ENCOUNTER — Ambulatory Visit (INDEPENDENT_AMBULATORY_CARE_PROVIDER_SITE_OTHER): Payer: Medicare Other | Admitting: Internal Medicine

## 2016-09-29 ENCOUNTER — Encounter: Payer: Self-pay | Admitting: Internal Medicine

## 2016-09-29 VITALS — BP 133/69 | HR 83 | Temp 98.4°F | Ht 59.0 in | Wt 117.9 lb

## 2016-09-29 DIAGNOSIS — F419 Anxiety disorder, unspecified: Secondary | ICD-10-CM

## 2016-09-29 DIAGNOSIS — Z79899 Other long term (current) drug therapy: Secondary | ICD-10-CM | POA: Diagnosis not present

## 2016-09-29 DIAGNOSIS — Z981 Arthrodesis status: Secondary | ICD-10-CM | POA: Diagnosis not present

## 2016-09-29 DIAGNOSIS — Z79891 Long term (current) use of opiate analgesic: Secondary | ICD-10-CM

## 2016-09-29 DIAGNOSIS — G8929 Other chronic pain: Secondary | ICD-10-CM

## 2016-09-29 DIAGNOSIS — M5410 Radiculopathy, site unspecified: Secondary | ICD-10-CM

## 2016-09-29 DIAGNOSIS — Z9181 History of falling: Secondary | ICD-10-CM | POA: Diagnosis not present

## 2016-09-29 DIAGNOSIS — G4700 Insomnia, unspecified: Secondary | ICD-10-CM | POA: Diagnosis not present

## 2016-09-29 DIAGNOSIS — M5416 Radiculopathy, lumbar region: Secondary | ICD-10-CM

## 2016-09-29 MED ORDER — OXYCODONE HCL 10 MG PO TABS: 10.0000 mg | ORAL_TABLET | Freq: Four times a day (QID) | ORAL | 0 refills | Status: DC | PRN

## 2016-09-29 MED ORDER — CITALOPRAM HYDROBROMIDE 40 MG PO TABS: 40.0000 mg | ORAL_TABLET | Freq: Every day | ORAL | 0 refills | Status: AC

## 2016-09-29 MED ORDER — ZOLPIDEM TARTRATE 5 MG PO TABS: 5.0000 mg | ORAL_TABLET | Freq: Every evening | ORAL | 0 refills | Status: AC | PRN

## 2016-09-29 MED ORDER — OXYCODONE HCL 10 MG PO TABS: 10.0000 mg | ORAL_TABLET | Freq: Four times a day (QID) | ORAL | 0 refills | Status: AC | PRN

## 2016-09-29 NOTE — Patient Instructions (Signed)
It was a pleasure to see you today. I have increased your Celexa prescription to 40mg  daily. Please try this increased dose for at least several weeks and see if it helps improve your anxiety symptoms more.  I have also refilled your prescriptions for Ambien x1 and Oxycodone x3 months. Please return to clinic and see me within 3 months or sooner if you are having new or worsening problems.

## 2016-09-29 NOTE — Progress Notes (Signed)
   CC: Anxiety, chronic pain  HPI:  [name] is a 40 y.o. female with PMH as listed below who presents for follow up management of her anxiety and left leg and back pain.  Patient with chronic low back pain with radiculopathy greatest on LLE. She is s/p L5-S1 discectomy. She takes chronic narcotics for pain close to 15 years. Currently takes Oxycodone 10 mg q6h prn #120/month and states she needs to use this four times a day for pain. She uses a cane to assist with ambulation and has had 1 fall in the past 3 months. She continues to suffer from very significant anxiety limiting her social activities. She also has difficulty remaining asleep with poor sleep quality that is an ongoing problem. She tried using Ambien for several doses without very significant benefit in her sleep or daytime sleepiness.  See problem based assessment and plan below for additional details.  Past Medical History:  Diagnosis Date  . Abnormal Pap smear   . Anxiety   . Asthma   . Chronic back pain 2000     1.  Postoperative changes left hemilaminotomy L5-S1.  Marland Kitchen. Complication of anesthesia    11/15 while having tooth extraction woke up.  . Constipation   . Depression   . Foot drop    left foot  . GERD (gastroesophageal reflux disease)   . Nerve damage    bilateral legs   . Seizures (HCC)    10/06/14- over 6 months ago- "maybe antidepressant- Zoloft.    Review of Systems:  Review of Systems  Constitutional: Negative for weight loss.  Gastrointestinal: Negative for abdominal pain and constipation.  Genitourinary: Negative for dysuria.  Musculoskeletal: Positive for back pain and falls.  Skin: Negative for rash.  Neurological: Positive for focal weakness.  Psychiatric/Behavioral: Negative for suicidal ideas. The patient is nervous/anxious and has insomnia.     Physical Exam:  Vitals:   09/29/16 1424  BP: 133/69  Pulse: 83  Temp: 98.4 F (36.9 C)  TempSrc: Oral  SpO2: 99%  Weight: 117  lb 14.4 oz (53.5 kg)  Height: 4\' 11"  (1.499 m)   GENERAL- alert, co-operative, NAD HEENT- Atraumatic, PERRL, oral mucosa appears moist CARDIAC- RRR, no murmurs RESP- CTAB, no wheezes or crackles BACK- Paraspinal tenderness to palpation in bilateral lower back NEURO- Sensation intact globally EXTREMITIES- Distal LLE strength diminished particularly 2/5 strength in dorsiflexion of foot, moderate muscle atrophy compared to distal right leg PSYCH- Seems anxious with some tremulousness   Assessment & Plan:   See Encounters Tab for problem based charting.  Patient discussed with Dr. Oswaldo DoneVincent

## 2016-10-03 NOTE — Assessment & Plan Note (Signed)
A: She is still having anxiety very limiting to her quality of life. Her primary engagement is with children at home and at church as her main social interactions. She denies any difficulty or obvious averse reactions to Celexa and is amenable to increasing her dose to see if this improves her symptoms. Her anxiety is also contributing to her insomnia and probably pain experience as well. I did not complete a formal assessment but GAD-7 or similar at her next visit would offer an additional monitoring option.  P: -Increase Celexa to 40mg  daily

## 2016-10-03 NOTE — Assessment & Plan Note (Signed)
A: Her insomnia is multifactorial at this time. She has significant anxiety which is likely contributory. She also has poor sleep hygiene due to reduced daytime activities as she spends most her time at home due to not working or driving which results in frequent daytime napping. There is also likely a component of central sleep apnea as commented from her sleep study last year. I expect her poor sleep quality is worsening her other problems of anxiety and pain.  P: -Discussed importance of structured day and night wakefulness/sleeping to try and reduce naps -Will try a second month with Ambien while titrating up SSRI therapy. I doubt this is going to help a lot long term especially with sleep hygiene and possibly apnea components

## 2016-10-03 NOTE — Assessment & Plan Note (Signed)
A: Her pain is currently pretty well controlled. Addition of gabapentin due to not tolerating duloxetine seems like a good non-narcotic option for her. She is still on a considerable total dose of sedating medications. Her falls remain fairly infrequent and she attributes them mostly to tripping over her left foot when not using a cane. She denies constipation from her chronic opioids.  P: -[place] controlled substance reporting system reviewed and refill history is appropriate -Refill oxycodone #120 tablets x3 months printed and given to pt at this visit -Continue gabapentin 600mg  BID -She will need new UDS at next clinic encounter -F/U within 3 months

## 2016-10-04 NOTE — Progress Notes (Signed)
Internal Medicine Clinic Attending  Case discussed with Dr. Rice at the time of the visit.  We reviewed the resident's history and exam and pertinent patient test results.  I agree with the assessment, diagnosis, and plan of care documented in the resident's note.  

## 2016-10-06 ENCOUNTER — Encounter: Payer: Medicare Other | Admitting: Internal Medicine

## 2016-10-18 ENCOUNTER — Other Ambulatory Visit: Payer: Self-pay | Admitting: Internal Medicine

## 2016-10-18 ENCOUNTER — Encounter: Payer: Self-pay | Admitting: Internal Medicine

## 2016-10-18 DIAGNOSIS — M5410 Radiculopathy, site unspecified: Secondary | ICD-10-CM

## 2016-10-18 DIAGNOSIS — F419 Anxiety disorder, unspecified: Secondary | ICD-10-CM

## 2016-10-18 NOTE — Telephone Encounter (Signed)
She had concern at the last clinic visit this month that Victoria Ellis was not helping much for sleep. Combined with her chronic narcotics this medication is higher risk. I would want to discuss weaning this with her before more refills. I will be in clinic all month January if she can arrange a visit.

## 2016-10-19 ENCOUNTER — Other Ambulatory Visit: Payer: Self-pay | Admitting: *Deleted

## 2016-10-19 DIAGNOSIS — M5410 Radiculopathy, site unspecified: Secondary | ICD-10-CM

## 2016-10-20 MED ORDER — GABAPENTIN 600 MG PO TABS: 600.0000 mg | ORAL_TABLET | Freq: Two times a day (BID) | ORAL | 5 refills | Status: AC

## 2016-11-23 DEATH — deceased

## 2017-01-17 ENCOUNTER — Encounter: Payer: Self-pay | Admitting: Internal Medicine

## 2017-02-01 ENCOUNTER — Telehealth: Payer: Self-pay | Admitting: Internal Medicine

## 2017-02-01 NOTE — Telephone Encounter (Signed)
Calling to confirm appointment for 02/02/17 at 3:15 call rejected

## 2017-02-02 ENCOUNTER — Encounter: Payer: Medicare Other | Admitting: Internal Medicine
